# Patient Record
Sex: Female | Born: 1975 | Race: White | Hispanic: No | Marital: Married | Smoking: Never smoker
Health system: Southern US, Community
[De-identification: ages and names within clinical notes are randomized; demographics above are authoritative.]

## PROBLEM LIST (undated history)

## (undated) DIAGNOSIS — O1490 Unspecified pre-eclampsia, unspecified trimester: Secondary | ICD-10-CM

## (undated) HISTORY — PX: CHOLECYSTECTOMY: SHX55

## (undated) HISTORY — PX: ANKLE SURGERY: SHX546

---

## 2017-02-19 ENCOUNTER — Encounter (HOSPITAL_COMMUNITY): Payer: Self-pay

## 2017-02-19 ENCOUNTER — Emergency Department (HOSPITAL_COMMUNITY): Payer: Self-pay

## 2017-02-19 ENCOUNTER — Emergency Department (HOSPITAL_COMMUNITY)
Admission: EM | Admit: 2017-02-19 | Discharge: 2017-02-20 | Disposition: A | Discharge status: Home / Self Care | Payer: Self-pay | Attending: Emergency Medicine | Admitting: Emergency Medicine

## 2017-02-19 DIAGNOSIS — R059 Cough, unspecified: Secondary | ICD-10-CM

## 2017-02-19 DIAGNOSIS — K449 Diaphragmatic hernia without obstruction or gangrene: Secondary | ICD-10-CM | POA: Insufficient documentation

## 2017-02-19 DIAGNOSIS — K219 Gastro-esophageal reflux disease without esophagitis: Secondary | ICD-10-CM

## 2017-02-19 DIAGNOSIS — R05 Cough: Secondary | ICD-10-CM | POA: Insufficient documentation

## 2017-02-19 HISTORY — DX: Unspecified pre-eclampsia, unspecified trimester: O14.90

## 2017-02-19 MED ORDER — ALBUTEROL SULFATE (2.5 MG/3ML) 0.083% IN NEBU
5.0000 mg | INHALATION_SOLUTION | Freq: Once | RESPIRATORY_TRACT | Status: AC
  Administered 2017-02-19: 5 mg via RESPIRATORY_TRACT
  Filled 2017-02-19: qty 6

## 2017-02-19 MED ORDER — IBUPROFEN 800 MG PO TABS
800.0000 mg | ORAL_TABLET | Freq: Once | ORAL | Status: AC
  Administered 2017-02-19: 800 mg via ORAL
  Filled 2017-02-19: qty 1

## 2017-02-19 NOTE — ED Notes (Signed)
Pt complains of a headache similar to the preeclamptic headache she had after giving birth

## 2017-02-20 ENCOUNTER — Emergency Department (HOSPITAL_COMMUNITY): Payer: Self-pay

## 2017-02-20 ENCOUNTER — Encounter (HOSPITAL_COMMUNITY): Payer: Self-pay | Admitting: Emergency Medicine

## 2017-02-20 LAB — I-STAT CHEM 8, ED
BUN: 7 mg/dL (ref 6–20)
CHLORIDE: 107 mmol/L (ref 101–111)
CREATININE: 0.5 mg/dL (ref 0.44–1.00)
Calcium, Ion: 1.01 mmol/L — ABNORMAL LOW (ref 1.15–1.40)
GLUCOSE: 115 mg/dL — AB (ref 65–99)
HEMATOCRIT: 36 % (ref 36.0–46.0)
Hemoglobin: 12.2 g/dL (ref 12.0–15.0)
POTASSIUM: 3.6 mmol/L (ref 3.5–5.1)
Sodium: 139 mmol/L (ref 135–145)
TCO2: 21 mmol/L (ref 0–100)

## 2017-02-20 LAB — D-DIMER, QUANTITATIVE: D-Dimer, Quant: 1.78 ug/mL-FEU — ABNORMAL HIGH (ref 0.00–0.50)

## 2017-02-20 MED ORDER — IOPAMIDOL (ISOVUE-370) INJECTION 76%
100.0000 mL | Freq: Once | INTRAVENOUS | Status: AC | PRN
  Administered 2017-02-20: 100 mL via INTRAVENOUS

## 2017-02-20 MED ORDER — OMEPRAZOLE 20 MG PO CPDR: 20.0000 mg | DELAYED_RELEASE_CAPSULE | Freq: Every day | ORAL | 1 refills | Status: DC

## 2017-02-20 MED ORDER — GI COCKTAIL ~~LOC~~
30.0000 mL | Freq: Once | ORAL | Status: AC
  Administered 2017-02-20: 30 mL via ORAL
  Filled 2017-02-20: qty 30

## 2017-02-20 MED ORDER — ALUM & MAG HYDROXIDE-SIMETH 400-400-40 MG/5ML PO SUSP: 10.0000 mL | Freq: Four times a day (QID) | ORAL | 0 refills | Status: DC | PRN

## 2017-02-20 MED ORDER — PREDNISONE 20 MG PO TABS
60.0000 mg | ORAL_TABLET | Freq: Once | ORAL | Status: AC
  Administered 2017-02-20: 60 mg via ORAL
  Filled 2017-02-20: qty 3

## 2017-02-20 MED ORDER — LORATADINE 10 MG PO TABS: 10.0000 mg | ORAL_TABLET | Freq: Every day | ORAL | 0 refills | Status: DC

## 2017-02-20 MED ORDER — BENZONATATE 100 MG PO CAPS: 100.0000 mg | ORAL_CAPSULE | Freq: Three times a day (TID) | ORAL | 0 refills | Status: DC | PRN

## 2017-02-20 MED ORDER — LORATADINE 10 MG PO TABS
10.0000 mg | ORAL_TABLET | Freq: Once | ORAL | Status: AC
  Administered 2017-02-20: 10 mg via ORAL
  Filled 2017-02-20: qty 1

## 2017-02-20 MED ORDER — IOPAMIDOL (ISOVUE-370) INJECTION 76%
INTRAVENOUS | Status: AC
  Administered 2017-02-20: 100 mL via INTRAVENOUS
  Filled 2017-02-20: qty 100

## 2017-02-20 MED ORDER — BENZONATATE 100 MG PO CAPS
200.0000 mg | ORAL_CAPSULE | Freq: Once | ORAL | Status: AC
  Administered 2017-02-20: 200 mg via ORAL
  Filled 2017-02-20: qty 2

## 2017-02-20 NOTE — ED Notes (Signed)
Assisted patient with going to the restroom.

## 2017-02-20 NOTE — Discharge Instructions (Signed)
We believe that your symptoms may be due to reflux. You have a hiatal hernia seen on CT. This can predispose you to reflux symptoms. We advised the use of Prilosec daily as well as Maalox as needed. You may take Tessalon as needed for persisting cough. We also advise the use of a daily allergy medication. Follow-up with a primary care doctor for further evaluation of your symptoms. You may return to the emergency department if symptoms persist or worsen.

## 2017-02-20 NOTE — ED Notes (Signed)
Patient to CT.

## 2017-02-20 NOTE — ED Notes (Signed)
Patient return from CT

## 2017-02-20 NOTE — ED Provider Notes (Signed)
[facility] DEPT Provider Note   CSN: [id number] Arrival date & time: 02/19/17  1910   By signing my name below, I, Madison Murillo, attest that this documentation has been prepared under the direction and in the presence of [employer], [place]. Electronically Signed: Neta Murillo, ED Scribe. 02/20/2017. 12:59 AM.   History   Chief Complaint Chief Complaint  Patient presents with  . Shortness of Breath    The history is provided by the patient. No language interpreter was used.   HPI Comments:  Madison Murillo is a 41 y.o. female who presents to the Emergency Department complaining of an intermittent cough for several months. She states that 1 week ago her cough came back and has been persistent since, and is worse when laying down. Pt complains of associated chest pain with cough, SOB, dizziness, headache. She states, "when I cough, everything hurts". She reports hx of GERD. Denies hx of DVT/PE. Pt has taken Advil allergy & sinus and NyQuil with no relief for cough. She was given albuterol at the ED which caused her cough to worsen. She took Advil today with moderate relief for headache. Pt denies other associated symptoms.    Past Medical History:  Diagnosis Date  . Preeclampsia   . Vaginal delivery     There are no active problems to display for this patient.   Past Surgical History:  Procedure Laterality Date  . ANKLE SURGERY Left   . CHOLECYSTECTOMY      OB History    No data available       Home Medications    Prior to Admission medications   Medication Sig Start Date End Date Taking? Authorizing Provider  Phenylephrine-DM-GG-APAP (MUCINEX FAST-MAX CONGEST COLD) 5-10-200-325 MG TABS Take 1 tablet by mouth as needed (congestion).   Yes Historical Provider, MD  Pseudoephedrine-APAP-DM (DAYQUIL PO) Take 1 capsule by mouth every 6 (six) hours as needed (cold).   Yes Historical Provider, MD  Pseudoephedrine-Ibuprofen (ADVIL COLD/SINUS) 30-200 MG TABS Take 1  capsule by mouth every 6 (six) hours.   Yes Historical Provider, MD  alum & mag hydroxide-simeth (MAALOX ADVANCED MAX ST) 841-660-63 MG/5ML suspension Take 10 mLs by mouth every 6 (six) hours as needed for indigestion. 02/20/17   Antonietta Breach, PA-C  benzonatate (TESSALON) 100 MG capsule Take 1 capsule (100 mg total) by mouth 3 (three) times daily as needed for cough. 02/20/17   Antonietta Breach, PA-C  loratadine (CLARITIN) 10 MG tablet Take 1 tablet (10 mg total) by mouth daily. 02/20/17   Antonietta Breach, PA-C  omeprazole (PRILOSEC) 20 MG capsule Take 1 capsule (20 mg total) by mouth daily. 02/20/17   Antonietta Breach, PA-C    Family History History reviewed. No pertinent family history.  Social History Social History  Substance Use Topics  . Smoking status: Never Smoker  . Smokeless tobacco: Never Used  . Alcohol use No     Allergies   Codeine   Review of Systems Review of Systems All systems reviewed and are negative for acute change except as noted in the HPI.   Physical Exam Updated Vital Signs BP (!) 159/107 (BP Location: Right Arm)   Pulse 77   Temp 98.3 F (36.8 C) (Oral)   Resp 19   Ht 5\' 5"  (1.651 m)   Wt 127 kg   LMP 02/13/2017   SpO2 93%   BMI 46.59 kg/m   Physical Exam  Constitutional: She is oriented to person, place, and time. She appears well-developed  and well-nourished. No distress.  Super morbidly obese. Pleasant. In NAD.  HENT:  Head: Normocephalic and atraumatic.  Eyes: Conjunctivae and EOM are normal. No scleral icterus.  Neck: Normal range of motion.  Cardiovascular: Normal rate, regular rhythm and intact distal pulses.   Pulmonary/Chest: Effort normal. No respiratory distress. She has no wheezes. She has no rales.  Dry, harsh cough noted. Lungs grossly clear. Chest expansion symmetric.  Musculoskeletal: Normal range of motion.  Neurological: She is alert and oriented to person, place, and time. She exhibits normal muscle tone. Coordination normal.  GCS 15.  Patient moving all extremities.  Skin: Skin is warm and dry. No rash noted. She is not diaphoretic. No erythema. No pallor.  Psychiatric: She has a normal mood and affect. Her behavior is normal.  Nursing note and vitals reviewed.    ED Treatments / Results  DIAGNOSTIC STUDIES:  Oxygen Saturation is 97% on RA, normal by my interpretation.    COORDINATION OF CARE:  12:38 AM  Discussed treatment plan with pt at bedside and pt agreed to plan.   Labs (all labs ordered are listed, but only abnormal results are displayed) Labs Reviewed  D-DIMER, QUANTITATIVE (NOT AT [facility]) - Abnormal; Notable for the following:       Result Value   D-Dimer, Quant 1.78 (*)    All other components within normal limits  I-STAT CHEM 8, ED - Abnormal; Notable for the following:    Glucose, Bld 115 (*)    Calcium, Ion 1.01 (*)    All other components within normal limits    EKG  EKG Interpretation  Date/Time:  Tuesday Feb 19 2017 19:22:30 EDT Ventricular Rate:  116 PR Interval:    QRS Duration: 92 QT Interval:  329 QTC Calculation: 457 R Axis:   42 Text Interpretation:  Sinus tachycardia Borderline T abnormalities, inferior leads Baseline wander in lead(s) II III aVF V6 No previous ECGs available Confirmed by Florina Ou  MD, Jenny Reichmann ([id number]) on 02/19/2017 11:34:09 PM       Radiology Dg Chest 2 View  Result Date: 02/19/2017 CLINICAL DATA:  Shortness of breath and cough EXAM: CHEST  2 VIEW COMPARISON:  None. FINDINGS: The heart size and mediastinal contours are within normal limits. Both lungs are clear. The visualized skeletal structures are unremarkable. IMPRESSION: No active cardiopulmonary disease. Electronically Signed   By: Donavan Foil M.D.   On: 02/19/2017 20:10   Ct Angio Chest Pe W And/or Wo Contrast  Result Date: 02/20/2017 CLINICAL DATA:  Intermittent cough for several months. Chest pain tonight. EXAM: CT ANGIOGRAPHY CHEST WITH CONTRAST TECHNIQUE: Multidetector CT imaging of the chest was  performed using the standard protocol during bolus administration of intravenous contrast. Multiplanar CT image reconstructions and MIPs were obtained to evaluate the vascular anatomy. CONTRAST:  100 mL Isovue 370 intravenous COMPARISON:  Radiographs 02/19/2017 FINDINGS: Cardiovascular: Satisfactory opacification of the pulmonary arteries to the segmental level. No evidence of pulmonary embolism. Normal heart size. No pericardial effusion. Mediastinum/Nodes: No enlarged mediastinal, hilar, or axillary lymph nodes. Thyroid gland, trachea, and esophagus demonstrate no significant findings. Lungs/Pleura: Lungs are clear. No pleural effusion or pneumothorax. Upper Abdomen: Small hiatal hernia. Focal low-attenuation lesion in the right hepatic lobe measuring at least 2.8 cm, incompletely imaged and not characterized. Musculoskeletal: No significant skeletal lesion. Review of the MIP images confirms the above findings. IMPRESSION: 1. Negative for acute pulmonary embolism. 2. Small hiatal hernia. 3. Incompletely imaged low-attenuation lesion in the right hepatic lobe. CT or ultrasound may  be useful to characterize. Electronically Signed   By: Andreas Newport M.D.   On: 02/20/2017 04:00    Procedures Procedures (including critical care time)  Medications Ordered in ED Medications  albuterol (PROVENTIL) (2.5 MG/3ML) 0.083% nebulizer solution 5 mg (5 mg Nebulization Given 02/19/17 1931)  ibuprofen (ADVIL,MOTRIN) tablet 800 mg (800 mg Oral Given 02/19/17 2332)  gi cocktail (Maalox,Lidocaine,Donnatal) (30 mLs Oral Given 02/20/17 0137)  loratadine (CLARITIN) tablet 10 mg (10 mg Oral Given 02/20/17 0136)  predniSONE (DELTASONE) tablet 60 mg (60 mg Oral Given 02/20/17 0135)  benzonatate (TESSALON) capsule 200 mg (200 mg Oral Given 02/20/17 0136)  iopamidol (ISOVUE-370) 76 % injection 100 mL (100 mLs Intravenous Contrast Given 02/20/17 0338)     Initial Impression / Assessment and Plan / ED Course  I have reviewed the triage  vital signs and the nursing notes.  Pertinent labs & imaging results that were available during my care of the patient were reviewed by me and considered in my medical decision making (see chart for details).     41 year old female presents to the emergency department for evaluation of a cough. This has been present for several months. Cough is dry in nature. Patient is afebrile today. No significant hypoxia. Lung sounds clear on examination.  Unable to exclude possible pulmonary embolus given oxygen saturations documented below 95% and tachycardia on arrival. Patient is, overall, low risk for PE. She did have a positive d-dimer today. A CT angiogram was performed which was negative for PE.  CT did note a small hiatal hernia. Patient also endorses a history of reflux. I have a higher suspicion for reflux as the cause of her symptoms today. She has had improvement in her cough with supportive management in the emergency department. Plan to discharge with Prilosec and Maalox. Tessalon also provided for cough. I have recommended the use of a daily allergy medication. Patient to follow-up with a primary care doctor for recheck of symptoms. Return precautions discussed and provided. Patient discharged in stable condition with no unaddressed concerns.   Final Clinical Impressions(s) / ED Diagnoses   Final diagnoses:  Cough  Hiatal hernia with GERD    New Prescriptions Discharge Medication List as of 02/20/2017  4:40 AM    START taking these medications   Details  alum & mag hydroxide-simeth (MAALOX ADVANCED MAX ST) 357-017-79 MG/5ML suspension Take 10 mLs by mouth every 6 (six) hours as needed for indigestion., Starting Wed 02/20/2017, Print    benzonatate (TESSALON) 100 MG capsule Take 1 capsule (100 mg total) by mouth 3 (three) times daily as needed for cough., Starting Wed 02/20/2017, Print    loratadine (CLARITIN) 10 MG tablet Take 1 tablet (10 mg total) by mouth daily., Starting Wed 02/20/2017,  Print    omeprazole (PRILOSEC) 20 MG capsule Take 1 capsule (20 mg total) by mouth daily., Starting Wed 02/20/2017, Print       I personally performed the services described in this documentation, which was scribed in my presence. The recorded information has been reviewed and is accurate.       Antonietta Breach, PA-C 02/20/17 [place], MD 02/20/17 [phone]

## 2017-02-20 NOTE — ED Notes (Signed)
Attempted an IV x 2 with no success.  

## 2017-02-20 NOTE — ED Notes (Signed)
Patient is alert and oriented x3.  She was given DC instructions and follow up visit instructions.  Patient gave verbal understanding. She was DC ambulatory under her own power to home.  V/S stable.  He was not showing any signs of distress on DC 

## 2017-12-26 ENCOUNTER — Emergency Department (HOSPITAL_COMMUNITY): Payer: Self-pay

## 2017-12-26 ENCOUNTER — Encounter (HOSPITAL_COMMUNITY): Payer: Self-pay

## 2017-12-26 ENCOUNTER — Emergency Department (HOSPITAL_COMMUNITY)
Admission: EM | Admit: 2017-12-26 | Discharge: 2017-12-27 | Disposition: A | Discharge status: Home / Self Care | Payer: Self-pay | Attending: Emergency Medicine | Admitting: Emergency Medicine

## 2017-12-26 DIAGNOSIS — Z79899 Other long term (current) drug therapy: Secondary | ICD-10-CM | POA: Insufficient documentation

## 2017-12-26 DIAGNOSIS — H6092 Unspecified otitis externa, left ear: Secondary | ICD-10-CM | POA: Insufficient documentation

## 2017-12-26 DIAGNOSIS — R519 Headache, unspecified: Secondary | ICD-10-CM

## 2017-12-26 DIAGNOSIS — H60502 Unspecified acute noninfective otitis externa, left ear: Secondary | ICD-10-CM

## 2017-12-26 DIAGNOSIS — R51 Headache: Secondary | ICD-10-CM | POA: Insufficient documentation

## 2017-12-26 DIAGNOSIS — R079 Chest pain, unspecified: Secondary | ICD-10-CM | POA: Insufficient documentation

## 2017-12-26 LAB — BASIC METABOLIC PANEL
Anion gap: 14 (ref 5–15)
BUN: <5 mg/dL — ABNORMAL LOW (ref 6–20)
CHLORIDE: 101 mmol/L (ref 101–111)
CO2: 23 mmol/L (ref 22–32)
Calcium: 9.3 mg/dL (ref 8.9–10.3)
Creatinine, Ser: 0.56 mg/dL (ref 0.44–1.00)
GFR calc non Af Amer: >60 mL/min (ref >60)
Glucose, Bld: 117 mg/dL — ABNORMAL HIGH (ref 65–99)
POTASSIUM: 3.5 mmol/L (ref 3.5–5.1)
Sodium: 138 mmol/L (ref 135–145)

## 2017-12-26 LAB — CBC WITH DIFFERENTIAL/PLATELET
BASOS PCT: 0 %
Basophils Absolute: 0 10*3/uL (ref 0.0–0.1)
Eosinophils Absolute: 0.2 10*3/uL (ref 0.0–0.7)
Eosinophils Relative: 2 %
HEMATOCRIT: 36.3 % (ref 36.0–46.0)
HEMOGLOBIN: 11.5 g/dL — AB (ref 12.0–15.0)
LYMPHS ABS: 1.8 10*3/uL (ref 0.7–4.0)
Lymphocytes Relative: 19 %
MCH: 24.8 pg — AB (ref 26.0–34.0)
MCHC: 31.7 g/dL (ref 30.0–36.0)
MCV: 78.2 fL (ref 78.0–100.0)
MONOS PCT: 4 %
Monocytes Absolute: 0.4 10*3/uL (ref 0.1–1.0)
NEUTROS ABS: 7.1 10*3/uL (ref 1.7–7.7)
Neutrophils Relative %: 75 %
Platelets: 320 10*3/uL (ref 150–400)
RBC: 4.64 MIL/uL (ref 3.87–5.11)
RDW: 17.2 % — ABNORMAL HIGH (ref 11.5–15.5)
WBC: 9.5 10*3/uL (ref 4.0–10.5)

## 2017-12-26 LAB — I-STAT TROPONIN, ED: Troponin i, poc: 0 ng/mL (ref 0.00–0.08)

## 2017-12-26 LAB — D-DIMER, QUANTITATIVE: D-Dimer, Quant: 0.4 ug/mL-FEU (ref 0.00–0.50)

## 2017-12-26 MED ORDER — CIPROFLOXACIN-DEXAMETHASONE 0.3-0.1 % OT SUSP
4.0000 [drp] | Freq: Once | OTIC | Status: AC
Start: 2017-12-26 — End: 2017-12-27
  Administered 2017-12-27: 4 [drp] via OTIC
  Filled 2017-12-26: qty 7.5

## 2017-12-26 MED ORDER — OXYCODONE-ACETAMINOPHEN 5-325 MG PO TABS
1.0000 | ORAL_TABLET | Freq: Once | ORAL | Status: AC
  Administered 2017-12-27: 1 via ORAL
  Filled 2017-12-26: qty 1

## 2017-12-26 NOTE — ED Notes (Signed)
Pt refused vitals until she is place in a room to see doctor.

## 2017-12-26 NOTE — ED Triage Notes (Signed)
Pt presents with 2 day h/o L ear pain that is now to L side of face, denies any drainage to ear.  Today, pt was dealing with stressful phone call at work and began to have mid-sternal chest pain that radiates to mid-scapular area; +shortness of breath; chest pain worsens with deep inspiration.

## 2017-12-26 NOTE — ED Provider Notes (Signed)
Patient placed in Quick Look pathway, seen and evaluated   Chief Complaint: ear pain, chest pain, shortness of breath  HPI:   42 y.o. female here with left ear pain and left side facial pain that started 2 days ago and has gotten worse. Today patient reports she was at work and dealing with a stressful phone call and began having med-sternal chest pain and shortness of breath. She was not sure if it was just due to the stress or if something else was going on.   ROS: HENT: left ear pain  Cardiac: chest pain  Resp: shortness of breath  Physical Exam:  BP (!) 213/104 (BP Location: Right Arm)   Pulse 97   Temp 98.6 F (37 C) (Oral)   Resp 18   Ht 5\' 6"  (1.676 m)   Wt 108.9 kg (240 lb)   LMP 12/12/2017   SpO2 97%   BMI 38.74 kg/m    Gen: No distress  Neuro: Awake and Alert  Skin: Warm  Heart regular rate and rhythm  Lungs: without wheezing or rales. Chest  pain increases with deep breath.    Focused Exam:    Initiation of care has begun. The patient has been counseled on the process, plan, and necessity for staying for the completion/evaluation, and the remainder of the medical screening examination    Ashley Murrain, NP 12/26/17 Loretha Stapler    Duffy Bruce, MD 12/27/17 [phone]

## 2017-12-27 LAB — I-STAT TROPONIN, ED: Troponin i, poc: 0 ng/mL (ref 0.00–0.08)

## 2017-12-27 MED ORDER — OXYCODONE-ACETAMINOPHEN 5-325 MG PO TABS: 1.0000 | ORAL_TABLET | Freq: Four times a day (QID) | ORAL | 0 refills | Status: DC | PRN

## 2017-12-27 NOTE — ED Notes (Signed)
Attempted venipuncture for blood draw, x2, without success.

## 2017-12-27 NOTE — ED Provider Notes (Signed)
[facility] EMERGENCY DEPARTMENT Provider Note   CSN: [id number] Arrival date & time: 12/26/17  1828     History   Chief Complaint No chief complaint on file.   HPI Madison Murillo is a 42 y.o. female.  Patient with history of preeclampsia, no long-standing hypertension --presents with several days of left ear pain which started around the ear but has since radiated into the side of her face.  Pain has become severe tonight.  She has had associated blurry vision but no loss of vision.  Patient also reports having some mid chest pain started while at work today.  Seems to be related to when the pain in her left ear is more severe.  She states a feeling of fullness in the left ear.  She denies injuries.  No fevers, nausea, vomiting, or diarrhea.  She does not have a history of diabetes.  No history of high cholesterol, diabetes, smoking.  No known family history of coronary artery disease.       Past Medical History:  Diagnosis Date  . Preeclampsia   . Vaginal delivery     There are no active problems to display for this patient.   Past Surgical History:  Procedure Laterality Date  . ANKLE SURGERY Left   . CHOLECYSTECTOMY      OB History    No data available       Home Medications    Prior to Admission medications   Medication Sig Start Date End Date Taking? Authorizing Provider  alum & mag hydroxide-simeth (MAALOX ADVANCED MAX ST) 400-400-40 MG/5ML suspension Take 10 mLs by mouth every 6 (six) hours as needed for indigestion. 02/20/17   Antonietta Breach, PA-C  benzonatate (TESSALON) 100 MG capsule Take 1 capsule (100 mg total) by mouth 3 (three) times daily as needed for cough. 02/20/17   Antonietta Breach, PA-C  loratadine (CLARITIN) 10 MG tablet Take 1 tablet (10 mg total) by mouth daily. 02/20/17   Antonietta Breach, PA-C  omeprazole (PRILOSEC) 20 MG capsule Take 1 capsule (20 mg total) by mouth daily. 02/20/17   Antonietta Breach, PA-C  Phenylephrine-DM-GG-APAP (MUCINEX  FAST-MAX CONGEST COLD) 5-10-200-325 MG TABS Take 1 tablet by mouth as needed (congestion).    [provider]  Pseudoephedrine-APAP-DM (DAYQUIL PO) Take 1 capsule by mouth every 6 (six) hours as needed (cold).    [provider]  Pseudoephedrine-Ibuprofen (ADVIL COLD/SINUS) 30-200 MG TABS Take 1 capsule by mouth every 6 (six) hours.    [provider]    Family History History reviewed. No pertinent family history.  Social History Social History   Tobacco Use  . Smoking status: Never Smoker  . Smokeless tobacco: Never Used  Substance Use Topics  . Alcohol use: No  . Drug use: No     Allergies   Codeine   Review of Systems Review of Systems  Constitutional: Negative for diaphoresis and fever.  HENT: Positive for ear pain and hearing loss. Negative for ear discharge and facial swelling.   Eyes: Positive for visual disturbance (Blurry vision). Negative for redness.  Respiratory: Positive for shortness of breath. Negative for cough.   Cardiovascular: Positive for chest pain. Negative for palpitations and leg swelling.  Gastrointestinal: Negative for abdominal pain, nausea and vomiting.  Genitourinary: Negative for dysuria.  Musculoskeletal: Negative for back pain and neck pain.  Skin: Negative for rash.  Neurological: Negative for syncope and light-headedness.  Psychiatric/Behavioral: The patient is not nervous/anxious.      Physical  Exam Updated Vital Signs BP (!) 178/106 (BP Location: Right Arm)   Pulse 100   Temp 98.6 F (37 C) (Oral)   Resp 19   Ht 5\' 6"  (1.676 m)   Wt 108.9 kg (240 lb)   LMP 12/12/2017   SpO2 100%   BMI 38.74 kg/m   Physical Exam  Constitutional: She appears well-developed and well-nourished.  HENT:  Head: Normocephalic and atraumatic.  Right Ear: Tympanic membrane, external ear and ear canal normal. No drainage or swelling.  Left Ear: Tympanic membrane normal. There is drainage and swelling.  Patient with  swelling of the left TM with associated debris and tenderness with insertion of the speculum, all consistent with otitis externa.  Patient has generalized tenderness without swelling around the ear itself.  There is no erythema.  No focal mastoid tenderness.  Eyes: Conjunctivae are normal. Right eye exhibits no discharge. Left eye exhibits no discharge.  Neck: Normal range of motion. Neck supple.  Cardiovascular: Normal rate, regular rhythm and normal heart sounds.  Pulmonary/Chest: Effort normal and breath sounds normal. No stridor. No respiratory distress. She has no wheezes. She exhibits no tenderness.  Abdominal: Soft. There is no tenderness. There is no rebound and no guarding.  Musculoskeletal: She exhibits no edema or tenderness.  No clinical signs and symptoms of DVT.  Neurological: She is alert.  Skin: Skin is warm and dry.  Psychiatric: She has a normal mood and affect.  Nursing note and vitals reviewed.    ED Treatments / Results  Labs (all labs ordered are listed, but only abnormal results are displayed) Labs Reviewed  CBC WITH DIFFERENTIAL/PLATELET - Abnormal; Notable for the following components:      Result Value   Hemoglobin 11.5 (*)    MCH 24.8 (*)    RDW 17.2 (*)    All other components within normal limits  BASIC METABOLIC PANEL - Abnormal; Notable for the following components:   Glucose, Bld 117 (*)    BUN <5 (*)    All other components within normal limits  D-DIMER, QUANTITATIVE (NOT AT [facility])  I-STAT TROPONIN, ED  I-STAT TROPONIN, ED    ED ECG REPORT   Date: 12/27/2017  Rate: 105  Rhythm: sinus tachycardia  QRS Axis: normal  Intervals: normal  ST/T Wave abnormalities: nonspecific T wave changes  Conduction Disutrbances:none  Narrative Interpretation:   Old EKG Reviewed: unchanged  I have personally reviewed the EKG tracing and agree with the computerized printout as noted.   ED ECG REPORT (repeat)   Date: 12/27/2017  Rate: 96  Rhythm: normal  sinus rhythm  QRS Axis: normal  Intervals: normal  ST/T Wave abnormalities: nonspecific T wave changes  Conduction Disutrbances:none  Narrative Interpretation:   Old EKG Reviewed: unchanged except slower  I have personally reviewed the EKG tracing and agree with the computerized printout as noted.   Radiology Dg Chest 2 View  Result Date: 12/26/2017 CLINICAL DATA:  Patient with left ear pain.  Chest pain. EXAM: CHEST - 2 VIEW COMPARISON:  CT chest 02/20/2017. FINDINGS: Normal cardiac and mediastinal contours. No consolidative pulmonary opacities. No pleural effusion or pneumothorax. Regional skeleton is unremarkable. IMPRESSION: No acute cardiopulmonary process. Electronically Signed   By: Lovey Newcomer M.D.   On: 12/26/2017 19:34    Procedures Procedures (including critical care time)  Medications Ordered in ED Medications  oxyCODONE-acetaminophen (PERCOCET/ROXICET) 5-325 MG per tablet 1 tablet (1 tablet Oral Given 12/27/17 0018)  ciprofloxacin-dexamethasone (CIPRODEX) 0.3-0.1 % OTIC (EAR) suspension 4 drop (  4 drops Left EAR Given 12/27/17 0018)     Initial Impression / Assessment and Plan / ED Course  I have reviewed the triage vital signs and the nursing notes.  Pertinent labs & imaging results that were available during my care of the patient were reviewed by me and considered in my medical decision making (see chart for details).     Patient seen and examined.  EKG reviewed.  Workup ordered previously reviewed, added repeat troponin and EKG.  Vital signs reviewed and are as follows: BP (!) 178/106 (BP Location: Right Arm)   Pulse 100   Temp 98.6 F (37 C) (Oral)   Resp 19   Ht 5\' 6"  (1.676 m)   Wt 108.9 kg (240 lb)   LMP 12/12/2017   SpO2 100%   BMI 38.74 kg/m   Patient with clinical signs and symptoms of left-sided otitis externa.  Patient discussed with Dr. Betsey Holiday. Patient with market hypertension, likely related in part due to pain.  Medication ordered.  D-dimer  negative.  Discussed potential etiology of vascular dissection given ear/facial pain in setting of elevated BP. But patient has no vision loss and has other obviously apparent etiology of pain -- will not perform imaging now. Pt will need ENT f/u. Will start on abx ear drops and treat pain.   Patient counseled on use of narcotic pain medications. Counseled not to combine these medications with others containing tylenol. Urged not to drink alcohol, drive, or perform any other activities that requires focus while taking these medications. The patient verbalizes understanding and agrees with the plan.     Visual Acuity  Right Eye Distance: 10/40 Left Eye Distance: 10/50 Bilateral Distance: 10/40  Right Eye Near:   Left Eye Near:    Bilateral Near:      Final Clinical Impressions(s) / ED Diagnoses   Final diagnoses:  Acute otitis externa of left ear, unspecified type  Chest pain, unspecified type  Facial pain   Otitis externa: Clinical diagnosis.  Patient does have significant pain around the ear but no redness or warmth.  No focal mastoid tenderness.  No risk factors for malignant otitis externa.  She is not febrile and has normal white count.  Do not feel that she requires advanced imaging at this point.  I do not suspect a dissection.  Encourage close PCP/ENT follow-up.  Chest pain: Seems to be related to more severe ear pain.  She has unchanged EKG x2, troponin negative x2.  Low concern for ACS at this point given her other symptoms.  D-dimer was negative.  Hypertension: Likely elevated in part due to pain.  She will need to have this rechecked.  ED Discharge Orders        Ordered    oxyCODONE-acetaminophen (PERCOCET/ROXICET) 5-325 MG tablet  Every 6 hours PRN     12/27/17 0133       Carlisle Cater, PA-C 12/27/17 0142    Orpah Greek, MD 12/27/17 [phone]

## 2017-12-27 NOTE — ED Notes (Signed)
Pt commenting that she "can now feel a lump behind her L ear." Requesting to see the PA. PA notified and at bedside.

## 2017-12-27 NOTE — Discharge Instructions (Signed)
Please read and follow all provided instructions.  Your diagnoses today include:  1. Acute otitis externa of left ear, unspecified type   2. Chest pain, unspecified type   3. Facial pain     Tests performed today include:  An EKG of your heart - no signs of heart attack  A chest x-ray  Cardiac enzymes - a blood test for heart muscle damage, no sign of heart attack  Blood counts and electrolytes - normal infection fighting cells  Blood test for blood clot - negative  Vital signs. See below for your results today.   Medications prescribed:   Ciprodex ear drops - instill 4 drops into affected ear twice daily for 7 days   Percocet (oxycodone/acetaminophen) - narcotic pain medication  DO NOT drive or perform any activities that require you to be awake and alert because this medicine can make you drowsy. BE VERY CAREFUL not to take multiple medicines containing Tylenol (also called acetaminophen). Doing so can lead to an overdose which can damage your liver and cause liver failure and possibly death.  Take any prescribed medications only as directed.  Follow-up instructions: Please follow-up with your primary care provider as soon as you can for further evaluation of your symptoms and also to have your blood pressure rechecked. \  Call the ENT office tomorrow for follow-up.   Return instructions:  Return if you have worsening redness, swelling of your face, fever.  Additional Information: Chest pain comes from many different causes. Your caregiver has diagnosed you as having chest pain that is not specific for one problem, but does not require admission.  You are at low risk for an acute heart condition or other serious illness.   Your vital signs today were: BP (!) 175/101    Pulse (!) 107    Temp 98.6 F (37 C) (Oral)    Resp 20    Ht 5\' 6"  (1.676 m)    Wt 108.9 kg (240 lb)    LMP 12/12/2017    SpO2 96%    BMI 38.74 kg/m  If your blood pressure (BP) was elevated above  135/85 this visit, please have this repeated by your doctor within one month. --------------

## 2018-03-26 ENCOUNTER — Emergency Department (HOSPITAL_COMMUNITY)
Admission: EM | Admit: 2018-03-26 | Discharge: 2018-03-26 | Disposition: A | Discharge status: Home / Self Care | Payer: Self-pay | Attending: Emergency Medicine | Admitting: Emergency Medicine

## 2018-03-26 ENCOUNTER — Emergency Department (HOSPITAL_COMMUNITY): Payer: Self-pay

## 2018-03-26 ENCOUNTER — Other Ambulatory Visit: Payer: Self-pay

## 2018-03-26 DIAGNOSIS — R200 Anesthesia of skin: Secondary | ICD-10-CM | POA: Insufficient documentation

## 2018-03-26 DIAGNOSIS — R51 Headache: Secondary | ICD-10-CM | POA: Insufficient documentation

## 2018-03-26 DIAGNOSIS — I10 Essential (primary) hypertension: Secondary | ICD-10-CM | POA: Insufficient documentation

## 2018-03-26 DIAGNOSIS — R519 Headache, unspecified: Secondary | ICD-10-CM

## 2018-03-26 LAB — COMPREHENSIVE METABOLIC PANEL
ALT: 64 U/L — AB (ref 14–54)
AST: 20 U/L (ref 15–41)
Albumin: 3.1 g/dL — ABNORMAL LOW (ref 3.5–5.0)
Alkaline Phosphatase: 89 U/L (ref 38–126)
Anion gap: 5 (ref 5–15)
BILIRUBIN TOTAL: 0.4 mg/dL (ref 0.3–1.2)
BUN: 6 mg/dL (ref 6–20)
CALCIUM: 8.9 mg/dL (ref 8.9–10.3)
CO2: 28 mmol/L (ref 22–32)
CREATININE: 0.63 mg/dL (ref 0.44–1.00)
Chloride: 107 mmol/L (ref 101–111)
GFR calc non Af Amer: >60 mL/min (ref >60)
Glucose, Bld: 114 mg/dL — ABNORMAL HIGH (ref 65–99)
Potassium: 4.1 mmol/L (ref 3.5–5.1)
Sodium: 140 mmol/L (ref 135–145)
TOTAL PROTEIN: 6.6 g/dL (ref 6.5–8.1)

## 2018-03-26 LAB — CBC WITH DIFFERENTIAL/PLATELET
Abs Immature Granulocytes: 0.1 10*3/uL (ref 0.0–0.1)
BASOS ABS: 0 10*3/uL (ref 0.0–0.1)
BASOS PCT: 0 %
EOS ABS: 0.4 10*3/uL (ref 0.0–0.7)
EOS PCT: 5 %
HCT: 36 % (ref 36.0–46.0)
Hemoglobin: 10.9 g/dL — ABNORMAL LOW (ref 12.0–15.0)
Immature Granulocytes: 1 %
Lymphocytes Relative: 22 %
Lymphs Abs: 2.1 10*3/uL (ref 0.7–4.0)
MCH: 23.7 pg — ABNORMAL LOW (ref 26.0–34.0)
MCHC: 30.3 g/dL (ref 30.0–36.0)
MCV: 78.3 fL (ref 78.0–100.0)
Monocytes Absolute: 0.5 10*3/uL (ref 0.1–1.0)
Monocytes Relative: 5 %
Neutro Abs: 6.5 10*3/uL (ref 1.7–7.7)
Neutrophils Relative %: 67 %
PLATELETS: 323 10*3/uL (ref 150–400)
RBC: 4.6 MIL/uL (ref 3.87–5.11)
RDW: 17.1 % — AB (ref 11.5–15.5)
WBC: 9.6 10*3/uL (ref 4.0–10.5)

## 2018-03-26 LAB — I-STAT TROPONIN, ED: TROPONIN I, POC: 0 ng/mL (ref 0.00–0.08)

## 2018-03-26 MED ORDER — PROCHLORPERAZINE EDISYLATE 10 MG/2ML IJ SOLN
10.0000 mg | Freq: Once | INTRAMUSCULAR | Status: AC
  Administered 2018-03-26: 10 mg via INTRAVENOUS
  Filled 2018-03-26: qty 2

## 2018-03-26 MED ORDER — LISINOPRIL 10 MG PO TABS: 10.0000 mg | ORAL_TABLET | Freq: Every day | ORAL | 1 refills | Status: DC

## 2018-03-26 MED ORDER — HYDRALAZINE HCL 20 MG/ML IJ SOLN
10.0000 mg | INTRAMUSCULAR | Status: DC
  Filled 2018-03-26: qty 1

## 2018-03-26 MED ORDER — KETOROLAC TROMETHAMINE 30 MG/ML IJ SOLN
30.0000 mg | Freq: Once | INTRAMUSCULAR | Status: AC
  Administered 2018-03-26: 30 mg via INTRAVENOUS
  Filled 2018-03-26: qty 1

## 2018-03-26 MED ORDER — BUTALBITAL-APAP-CAFFEINE 50-325-40 MG PO TABS: 1.0000 | ORAL_TABLET | Freq: Four times a day (QID) | ORAL | 0 refills | Status: AC | PRN

## 2018-03-26 MED ORDER — SODIUM CHLORIDE 0.9 % IV SOLN
INTRAVENOUS | Status: DC
  Administered 2018-03-26: 1000 mL via INTRAVENOUS

## 2018-03-26 NOTE — Discharge Instructions (Signed)
Your symptoms today have resolved with the medications we have given.  Your symptoms were likely related to a complicated migraine which gave you the neurologic symptoms and have resolved with treatment of your headache.  Please take Tylenol or Fioricet for your headache.  Please avoid anti-inflammatory medication such as Advil or Naprosyn as they may cause her blood pressure to go up.  You should see your family doctor within 2 weeks for recheck of your blood pressure    Please take lisinopril 10 mg a day for the next 30 days, this may cause a cough or swelling of the mouth or tongue, if that occurs please stop taking it immediately and call your doctor or return to the emergency department if you cannot be seen immediately.  Emergency department for severe or worsening chest pain, headache, numbness or weakness difficulty speaking, difficulty with vision or balance.  Please obtain all of your results from medical records or have your doctors office obtain the results - share them with your doctor - you should be seen at your doctors office in the next 2 days. Call today to arrange your follow up. Take the medications as prescribed. Please review all of the medicines and only take them if you do not have an allergy to them. Please be aware that if you are taking birth control pills, taking other prescriptions, ESPECIALLY ANTIBIOTICS may make the birth control ineffective - if this is the case, either do not engage in sexual activity or use alternative methods of birth control such as condoms until you have finished the medicine and your family doctor says it is OK to restart them. If you are on a blood thinner such as COUMADIN, be aware that any other medicine that you take may cause the coumadin to either work too much, or not enough - you should have your coumadin level rechecked in next 7 days if this is the case.  ?  It is also a possibility that you have an allergic reaction to any of the medicines  that you have been prescribed - Everybody reacts differently to medications and while MOST people have no trouble with most medicines, you may have a reaction such as nausea, vomiting, rash, swelling, shortness of breath. If this is the case, please stop taking the medicine immediately and contact your physician.  ?  You should return to the ER if you develop severe or worsening symptoms.

## 2018-03-26 NOTE — ED Triage Notes (Addendum)
Pt from parking lot- was driving and pulled over due to headache, chest pain radiating down arm. Hypertensive 240/130. EKG NSR. Neuro intact. Does not take BP meds. Obese. EMS gave 1 nitro and 4 aspirin.

## 2018-03-26 NOTE — ED Provider Notes (Signed)
[facility] EMERGENCY DEPARTMENT Provider Note   CSN: [id number] Arrival date & time: 03/26/18  1052     History   Chief Complaint Chief Complaint  Patient presents with  . Chest Pain    HPI Madison Murillo is a 42 y.o. female.  HPI  42 y/o female - started with HA 3 days ago - BP was 218/120 - she was told by pharmacist to get checked out but she didn't - the HA has persisted and today on the way to work she has tightness in the chest and radiation in the L arm -she had difficulty driving her vehicle for some reason, she ultimately had to pull over and call 911 for transport to the hospital.  She states her hands are going numb.  She has had some nausea and diarrhea for a couple of days - she has had some nitro and asa en route - no longer having chest tinghtess - no numbness left.  She has no tobacco use - states she had post partum preeclampsia - but no longer takes medicines (had 2 preg with same).  Takes ibuprofn bid short term for recent headache.  Past Medical History:  Diagnosis Date  . Preeclampsia   . Vaginal delivery     There are no active problems to display for this patient.   Past Surgical History:  Procedure Laterality Date  . ANKLE SURGERY Left   . CHOLECYSTECTOMY       OB History   None      Home Medications    Prior to Admission medications   Medication Sig Start Date End Date Taking? Authorizing Provider  butalbital-acetaminophen-caffeine (FIORICET, ESGIC) [phone] MG tablet Take 1-2 tablets by mouth every 6 (six) hours as needed for headache. 03/26/18 03/26/19  Noemi Chapel, MD  lisinopril (PRINIVIL,ZESTRIL) 10 MG tablet Take 1 tablet (10 mg total) by mouth daily. 03/26/18   Noemi Chapel, MD  oxyCODONE-acetaminophen (PERCOCET/ROXICET) 5-325 MG tablet Take 1-2 tablets by mouth every 6 (six) hours as needed for severe pain. 12/27/17   Carlisle Cater, PA-C    Family History No family history on file.  Social History Social  History   Tobacco Use  . Smoking status: Never Smoker  . Smokeless tobacco: Never Used  Substance Use Topics  . Alcohol use: No  . Drug use: No     Allergies   Codeine   Review of Systems Review of Systems  All other systems reviewed and are negative.    Physical Exam Updated Vital Signs BP (!) 163/94 (BP Location: Right Arm)   Pulse 94   Temp 98.1 F (36.7 C) (Oral)   Resp 20   LMP 02/26/2018   SpO2 98%   Physical Exam  Constitutional: She appears well-developed and well-nourished. No distress.  HENT:  Head: Normocephalic and atraumatic.  Mouth/Throat: Oropharynx is clear and moist. No oropharyngeal exudate.  Eyes: Pupils are equal, round, and reactive to light. Conjunctivae and EOM are normal. Right eye exhibits no discharge. Left eye exhibits no discharge. No scleral icterus.  Neck: Normal range of motion. Neck supple. No JVD present. No thyromegaly present.  Cardiovascular: Normal rate, regular rhythm, normal heart sounds and intact distal pulses. Exam reveals no gallop and no friction rub.  No murmur heard. Pulmonary/Chest: Effort normal and breath sounds normal. No respiratory distress. She has no wheezes. She has no rales.  Abdominal: Soft. Bowel sounds are normal. She exhibits no distension and no mass. There is no tenderness.  Musculoskeletal: Normal range of motion. She exhibits no edema or tenderness.  Lymphadenopathy:    She has no cervical adenopathy.  Neurological: She is alert. Coordination normal.  Skin: Skin is warm and dry. No rash noted. No erythema.  Psychiatric: She has a normal mood and affect. Her behavior is normal.  Nursing note and vitals reviewed.   ED Treatments / Results  Labs (all labs ordered are listed, but only abnormal results are displayed) Labs Reviewed  COMPREHENSIVE METABOLIC PANEL - Abnormal; Notable for the following components:      Result Value   Glucose, Bld 114 (*)    Albumin 3.1 (*)    ALT 64 (*)    All other  components within normal limits  CBC WITH DIFFERENTIAL/PLATELET - Abnormal; Notable for the following components:   Hemoglobin 10.9 (*)    MCH 23.7 (*)    RDW 17.1 (*)    All other components within normal limits  I-STAT TROPONIN, ED    EKG EKG Interpretation  Date/Time:  Wednesday March 26 2018 10:59:14 EDT Ventricular Rate:  93 PR Interval:  128 QRS Duration: 80 QT Interval:  368 QTC Calculation: 457 R Axis:   51 Text Interpretation:  Normal sinus rhythm T wave abnormality, consider inferior ischemia Abnormal ECG c/w 3/19, no significant changes Confirmed by Noemi Chapel [phone]) on 03/26/2018 11:33:07 AM   Radiology Dg Chest 2 View  Result Date: 03/26/2018 CLINICAL DATA:  Chest pain. EXAM: CHEST - 2 VIEW COMPARISON:  Radiographs of December 26, 2017. FINDINGS: The heart size and mediastinal contours are within normal limits. Both lungs are clear. No pneumothorax or pleural effusion is noted. The visualized skeletal structures are unremarkable. IMPRESSION: No active cardiopulmonary disease. Electronically Signed   By: Marijo Conception, M.D.   On: 03/26/2018 11:34   Ct Head Wo Contrast  Result Date: 03/26/2018 CLINICAL DATA:  42 year old female with acute headache and hypertension. EXAM: CT HEAD WITHOUT CONTRAST TECHNIQUE: Contiguous axial images were obtained from the base of the skull through the vertex without intravenous contrast. COMPARISON:  None. FINDINGS: Brain: No evidence of acute infarction, hemorrhage, hydrocephalus, extra-axial collection or mass lesion/mass effect. Vascular: No hyperdense vessel or unexpected calcification. Skull: Normal. Negative for fracture or focal lesion. Sinuses/Orbits: No acute finding. Other: None. IMPRESSION: Unremarkable noncontrast head CT. Electronically Signed   By: Margarette Canada M.D.   On: 03/26/2018 13:55    Procedures Procedures (including critical care time)  Medications Ordered in ED Medications  0.9 %  sodium chloride infusion (1,000 mLs  Intravenous New Bag/Given 03/26/18 1430)  hydrALAZINE (APRESOLINE) injection 10 mg (10 mg Intravenous Not Given 03/26/18 1434)  prochlorperazine (COMPAZINE) injection 10 mg (10 mg Intravenous Given 03/26/18 1431)  ketorolac (TORADOL) 30 MG/ML injection 30 mg (30 mg Intravenous Given 03/26/18 1435)     Initial Impression / Assessment and Plan / ED Course  I have reviewed the triage vital signs and the nursing notes.  Pertinent labs & imaging results that were available during my care of the patient were reviewed by me and considered in my medical decision making (see chart for details).     The patient's symptoms are consistent with some type of neuro etiology, would consider complicate a migraine, stroke though this would be less likely given bilateral hand symptoms, she definitely has some give out weakness of the left hand and left leg but overall strength is normal.  She has a slight sensory deficit to the left upper extremity but her face and  cranial nerves are normal.  She has been having some floaters in her vision, bright lights flashing, she does report having prior migraines in the past where she had some type of visual aura though it was different than this.  We will treat blood pressure as needed, CT scan to rule out stroke, anticipate discharge if patient better.  CT negative, labs unremarkable, the patient has been treated for her blood pressure and states that she now feels completely better after the medications including the headache cocktail in the antihypertensive.  I had a long discussion with the patient regarding the need for chronic therapy for her essential hypertension as well as the potential risks of using lisinopril, she has agreed to these risks.  Final Clinical Impressions(s) / ED Diagnoses   Final diagnoses:  Acute nonintractable headache, unspecified headache type  Numbness  Essential hypertension    ED Discharge Orders        Ordered     butalbital-acetaminophen-caffeine (FIORICET, ESGIC) 50-325-40 MG tablet  Every 6 hours PRN     03/26/18 1559    lisinopril (PRINIVIL,ZESTRIL) 10 MG tablet  Daily     03/26/18 1559       Noemi Chapel, MD 03/26/18 [phone]

## 2018-07-29 ENCOUNTER — Other Ambulatory Visit: Payer: Self-pay

## 2018-07-29 ENCOUNTER — Emergency Department (HOSPITAL_COMMUNITY): Payer: Self-pay

## 2018-07-29 ENCOUNTER — Encounter (HOSPITAL_COMMUNITY): Payer: Self-pay | Admitting: Emergency Medicine

## 2018-07-29 ENCOUNTER — Emergency Department (HOSPITAL_COMMUNITY)
Admission: EM | Admit: 2018-07-29 | Discharge: 2018-07-29 | Disposition: A | Discharge status: Home / Self Care | Payer: Self-pay | Attending: Emergency Medicine | Admitting: Emergency Medicine

## 2018-07-29 DIAGNOSIS — S8011XA Contusion of right lower leg, initial encounter: Secondary | ICD-10-CM | POA: Insufficient documentation

## 2018-07-29 DIAGNOSIS — Z79899 Other long term (current) drug therapy: Secondary | ICD-10-CM | POA: Insufficient documentation

## 2018-07-29 DIAGNOSIS — Y999 Unspecified external cause status: Secondary | ICD-10-CM | POA: Insufficient documentation

## 2018-07-29 DIAGNOSIS — Y939 Activity, unspecified: Secondary | ICD-10-CM | POA: Insufficient documentation

## 2018-07-29 DIAGNOSIS — W01198A Fall on same level from slipping, tripping and stumbling with subsequent striking against other object, initial encounter: Secondary | ICD-10-CM | POA: Insufficient documentation

## 2018-07-29 DIAGNOSIS — Y929 Unspecified place or not applicable: Secondary | ICD-10-CM | POA: Insufficient documentation

## 2018-07-29 MED ORDER — HYDROCODONE-ACETAMINOPHEN 5-325 MG PO TABS: 1.0000 | ORAL_TABLET | Freq: Four times a day (QID) | ORAL | 0 refills | Status: DC | PRN

## 2018-07-29 NOTE — ED Notes (Signed)
ED Provider at bedside. 

## 2018-07-29 NOTE — ED Notes (Signed)
Pt verbalizes understanding of d/c instructions. Prescriptions reviewed with patient. Pt taken to lobby in wheelchair at d/c with all belongings and with family.   

## 2018-07-29 NOTE — ED Triage Notes (Addendum)
Pt reports mechanical fall, tripping over a mat and falling back, landing on her calf with her body, two days ago with pain increasing. Right leg injury with swelling and cool to touch. Bruising noted. Denies blood thinners. PMS intact. Pt reports pins and needles/numb feeling to her right foot - like her foot is asleep. Pain with movement and difficulty ambulating.

## 2018-07-29 NOTE — ED Notes (Signed)
Patient transported to X-ray 

## 2018-07-29 NOTE — ED Provider Notes (Signed)
Patient placed in Quick Look pathway, seen and evaluated   Chief Complaint: leg pain   HPI:   Mechanical fall from standing 2 days ago, tripped and fell on the outside of her right leg mostly on her lateral tib/fib. She was able to stand up on her own and ambulate the rest of the day.  Swelling, bruising, pain worsening. Associated with paresthesias.   ROS: negative: anticoagulants.   Physical Exam:   Gen: No distress  Neuro: Awake and Alert  Skin: Warm    Focused Exam: moderate ecchymosis to anterior/lateral tib/fib with exquisite tenderness locally to ecchymosis.  Moderate pain reported with passive flexion and extension of right ankle/knee.  1+ DP pulses bilaterally. Toes warm. 5/5 strength with ankle flexion/extension against resistance.    Initiation of care has begun. The patient has been counseled on the process, plan, and necessity for staying for the completion/evaluation, and the remainder of the medical screening examination    Arlean Hopping 07/29/18 1900    Quintella Reichert, MD 07/30/18 [phone]

## 2018-07-29 NOTE — ED Provider Notes (Signed)
[facility] EMERGENC[facility] DEPARTMENT Provider Note   CSN: [id number] Arrival date & time: 07/29/18  1836     History   Chief Complaint Chief Complaint  Patient presents with  . Leg Pain  . Fall    HPI Madison Murillo is a 42 y.o. female.  Patient stumbled and fell onto her right lower leg on Saturday, 07/26/18. She landed on her shin and the lateral aspect of the leg. She has had increasing pain, bruising, and paresthesias of the anterior leg and lateral calf. Pain increased with flexion and extension of the foot. No posterior calf pain. She is not on blood thinners.  The history is provided by the patient. No language interpreter was used.  Leg Pain   This is a new problem. The current episode started more than 2 days ago. The problem occurs constantly. The problem has been gradually worsening. The pain is present in the right lower leg. The quality of the pain is described as aching. The pain is moderate. Associated symptoms include tingling. Pertinent negatives include no numbness.  Fall     Past Medical History:  Diagnosis Date  . Preeclampsia   . Vaginal delivery     There are no active problems to display for this patient.   Past Surgical History:  Procedure Laterality Date  . ANKLE SURGERY Left   . CHOLECYSTECTOMY       OB History   None      Home Medications    Prior to Admission medications   Medication Sig Start Date End Date Taking? Authorizing Provider  butalbital-acetaminophen-caffeine (FIORICET, ESGIC) [phone] MG tablet Take 1-2 tablets by mouth every 6 (six) hours as needed for headache. 03/26/18 03/26/19  Noemi Chapel, MD  lisinopril (PRINIVIL,ZESTRIL) 10 MG tablet Take 1 tablet (10 mg total) by mouth daily. 03/26/18   Noemi Chapel, MD  oxyCODONE-acetaminophen (PERCOCET/ROXICET) 5-325 MG tablet Take 1-2 tablets by mouth every 6 (six) hours as needed for severe pain. 12/27/17   Carlisle Cater, PA-C    Family History No family  history on file.  Social History Social History   Tobacco Use  . Smoking status: Never Smoker  . Smokeless tobacco: Never Used  Substance Use Topics  . Alcohol use: No  . Drug use: No     Allergies   Codeine   Review of Systems Review of Systems  Neurological: Positive for tingling. Negative for numbness.  All other systems reviewed and are negative.    Physical Exam Updated Vital Signs BP (!) 188/86 (BP Location: Left Wrist)   Pulse (!) 105   Temp 98.5 F (36.9 C) (Oral)   Resp 18   Ht 5\' 5"  (1.651 m)   Wt 127 kg   LMP 07/18/2018   SpO2 99%   BMI 46.59 kg/m   Physical Exam  Constitutional: She is oriented to person, place, and time. She appears well-developed and well-nourished.  HENT:  Head: Atraumatic.  Eyes: Conjunctivae are normal.  Neck: Neck supple.  Cardiovascular: Normal rate and regular rhythm.  Pulmonary/Chest: Effort normal and breath sounds normal.  Abdominal: Soft.  Musculoskeletal: She exhibits edema and tenderness. She exhibits no deformity.  Bruising noted--see attached photo. Distal pulses and sensation present.   Neurological: She is alert and oriented to person, place, and time.  Skin: Skin is warm and dry.  Psychiatric: She has a normal mood and affect.  Nursing note and vitals reviewed.      ED Treatments / Results  Labs (all  labs ordered are listed, but only abnormal results are displayed) Labs Reviewed - No data to display  EKG None  Radiology Dg Tibia/fibula Right  Result Date: 07/29/2018 CLINICAL DATA:  Fall 2 days ago, lower leg pain. EXAM: RIGHT TIBIA AND FIBULA - 2 VIEW COMPARISON:  None. FINDINGS: Subcutaneous edema in the calf.  Anterior vascular phleboliths. Medial and lateral compartmental marginal spurring in the knee. No fracture is identified. Indistinct sclerosis in the distal fibular metaphysis, probably from a chronic ossified fibrous cortical defect or similar benign lesion. There is no associated periosteal  reaction to suggest an aggressive process. IMPRESSION: 1. No acute bony findings. 2. Mild subcutaneous edema in the calf. 3. Sclerosis in the distal tibial metaphysis, probably benign such as from a chronic ossified fibrous cortical defect or bone island. No appreciable periosteal reaction or other aggressive findings. 4. Vascular phleboliths. Electronically Signed   By: Van Clines M.D.   On: 07/29/2018 20:21   Dg Knee Complete 4 Views Right  Result Date: 07/29/2018 CLINICAL DATA:  Fall 2 days ago, knee pain. EXAM: RIGHT KNEE - COMPLETE 4+ VIEW COMPARISON:  None. FINDINGS: Tricompartmental marginal spurring. No definite effusion in the suprapatellar bursa. Soft tissue swelling anterior to the knee and proximal tibia but without high density to suggest prepatellar bursitis. There is spurring of the tibial spine. IMPRESSION: 1. No acute bony findings.  No definite knee effusion. 2. There is likely some prepatellar soft tissue swelling. 3. 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 4. If pain persists despite conservative therapy, MRI may be warranted for further characterization. Electronically Signed   By: Van Clines M.D.   On: 07/29/2018 20:26    Procedures Procedures (including critical care time)  Medications Ordered in ED Medications - No data to display   Initial Impression / Assessment and Plan / ED Course  I have reviewed the triage vital signs and the nursing notes.  Pertinent labs & imaging results that were available during my care of the patient were reviewed by me and considered in my medical decision making (see chart for details).     Patient X-Ray negative for obvious fracture or dislocation. Large contusion of right lower leg. No current indication of compartment syndrome.  Pt advised to follow up with orthopedics. Patient given ace wrap, crutches while in ED, care instructions provided and discussed. Patient will be discharged home & is  agreeable with above plan. Returns precautions discussed. Pt appears safe for discharge.  Final Clinical Impressions(s) / ED Diagnoses   Final diagnoses:  Contusion of right lower leg, initial encounter    ED Discharge Orders    None       Etta Quill, NP 07/29/18 2109    Dorie Rank, MD 07/30/18 [phone]

## 2018-07-29 NOTE — ED Notes (Signed)
See EDP assessment 

## 2020-12-18 ENCOUNTER — Emergency Department (HOSPITAL_COMMUNITY)
Admission: EM | Admit: 2020-12-18 | Discharge: 2020-12-18 | Disposition: A | Discharge status: Home / Self Care | Payer: BC Managed Care – PPO | Attending: Emergency Medicine | Admitting: Emergency Medicine

## 2020-12-18 ENCOUNTER — Emergency Department (HOSPITAL_COMMUNITY): Payer: BC Managed Care – PPO

## 2020-12-18 ENCOUNTER — Encounter (HOSPITAL_COMMUNITY): Payer: Self-pay | Admitting: Emergency Medicine

## 2020-12-18 DIAGNOSIS — D509 Iron deficiency anemia, unspecified: Secondary | ICD-10-CM | POA: Insufficient documentation

## 2020-12-18 DIAGNOSIS — I700 Atherosclerosis of aorta: Secondary | ICD-10-CM | POA: Diagnosis not present

## 2020-12-18 DIAGNOSIS — I10 Essential (primary) hypertension: Secondary | ICD-10-CM | POA: Insufficient documentation

## 2020-12-18 DIAGNOSIS — J90 Pleural effusion, not elsewhere classified: Secondary | ICD-10-CM | POA: Diagnosis not present

## 2020-12-18 DIAGNOSIS — R160 Hepatomegaly, not elsewhere classified: Secondary | ICD-10-CM | POA: Insufficient documentation

## 2020-12-18 DIAGNOSIS — R1010 Upper abdominal pain, unspecified: Secondary | ICD-10-CM | POA: Insufficient documentation

## 2020-12-18 DIAGNOSIS — R197 Diarrhea, unspecified: Secondary | ICD-10-CM | POA: Insufficient documentation

## 2020-12-18 DIAGNOSIS — R079 Chest pain, unspecified: Secondary | ICD-10-CM | POA: Diagnosis not present

## 2020-12-18 DIAGNOSIS — K7689 Other specified diseases of liver: Secondary | ICD-10-CM | POA: Diagnosis not present

## 2020-12-18 DIAGNOSIS — R110 Nausea: Secondary | ICD-10-CM | POA: Diagnosis not present

## 2020-12-18 DIAGNOSIS — Z79899 Other long term (current) drug therapy: Secondary | ICD-10-CM | POA: Diagnosis not present

## 2020-12-18 DIAGNOSIS — M549 Dorsalgia, unspecified: Secondary | ICD-10-CM | POA: Diagnosis not present

## 2020-12-18 DIAGNOSIS — I313 Pericardial effusion (noninflammatory): Secondary | ICD-10-CM | POA: Diagnosis not present

## 2020-12-18 DIAGNOSIS — J9811 Atelectasis: Secondary | ICD-10-CM | POA: Diagnosis not present

## 2020-12-18 LAB — COMPREHENSIVE METABOLIC PANEL
ALT: 16 U/L (ref 0–44)
AST: 11 U/L — ABNORMAL LOW (ref 15–41)
Albumin: 3.6 g/dL (ref 3.5–5.0)
Alkaline Phosphatase: 77 U/L (ref 38–126)
Anion gap: 10 (ref 5–15)
BUN: 10 mg/dL (ref 6–20)
CO2: 27 mmol/L (ref 22–32)
Calcium: 9.3 mg/dL (ref 8.9–10.3)
Chloride: 103 mmol/L (ref 98–111)
Creatinine, Ser: 0.69 mg/dL (ref 0.44–1.00)
GFR, Estimated: >60 mL/min (ref >60)
Glucose, Bld: 110 mg/dL — ABNORMAL HIGH (ref 70–99)
Potassium: 3.5 mmol/L (ref 3.5–5.1)
Sodium: 140 mmol/L (ref 135–145)
Total Bilirubin: 0.5 mg/dL (ref 0.3–1.2)
Total Protein: 7 g/dL (ref 6.5–8.1)

## 2020-12-18 LAB — CBC
HCT: 35.8 % — ABNORMAL LOW (ref 36.0–46.0)
Hemoglobin: 10.8 g/dL — ABNORMAL LOW (ref 12.0–15.0)
MCH: 23.7 pg — ABNORMAL LOW (ref 26.0–34.0)
MCHC: 30.2 g/dL (ref 30.0–36.0)
MCV: 78.5 fL — ABNORMAL LOW (ref 80.0–100.0)
Platelets: 280 10*3/uL (ref 150–400)
RBC: 4.56 MIL/uL (ref 3.87–5.11)
RDW: 17.6 % — ABNORMAL HIGH (ref 11.5–15.5)
WBC: 9.2 10*3/uL (ref 4.0–10.5)
nRBC: 0 % (ref 0.0–0.2)

## 2020-12-18 LAB — I-STAT BETA HCG BLOOD, ED (MC, WL, AP ONLY): I-stat hCG, quantitative: <5 m[IU]/mL (ref <5)

## 2020-12-18 LAB — URINALYSIS, ROUTINE W REFLEX MICROSCOPIC
Bilirubin Urine: NEGATIVE
Glucose, UA: NEGATIVE mg/dL
Hgb urine dipstick: NEGATIVE
Ketones, ur: NEGATIVE mg/dL
Leukocytes,Ua: NEGATIVE
Nitrite: NEGATIVE
Protein, ur: NEGATIVE mg/dL
Specific Gravity, Urine: 1.019 (ref 1.005–1.030)
pH: 7 (ref 5.0–8.0)

## 2020-12-18 LAB — LIPASE, BLOOD: Lipase: 36 U/L (ref 11–51)

## 2020-12-18 LAB — TROPONIN I (HIGH SENSITIVITY): Troponin I (High Sensitivity): 5 ng/L (ref <18)

## 2020-12-18 MED ORDER — LABETALOL HCL 5 MG/ML IV SOLN
5.0000 mg | Freq: Once | INTRAVENOUS | Status: AC
  Administered 2020-12-18: 5 mg via INTRAVENOUS
  Filled 2020-12-18: qty 4

## 2020-12-18 MED ORDER — IOHEXOL 350 MG/ML SOLN
100.0000 mL | Freq: Once | INTRAVENOUS | Status: AC | PRN
  Administered 2020-12-18: 100 mL via INTRAVENOUS

## 2020-12-18 MED ORDER — LOSARTAN POTASSIUM 25 MG PO TABS
50.0000 mg | ORAL_TABLET | Freq: Once | ORAL | Status: AC
  Administered 2020-12-18: 50 mg via ORAL
  Filled 2020-12-18: qty 2

## 2020-12-18 MED ORDER — AMLODIPINE BESYLATE 5 MG PO TABS
5.0000 mg | ORAL_TABLET | Freq: Once | ORAL | Status: AC
  Administered 2020-12-18: 5 mg via ORAL
  Filled 2020-12-18: qty 1

## 2020-12-18 MED ORDER — LOSARTAN POTASSIUM 50 MG PO TABS: 50.0000 mg | ORAL_TABLET | Freq: Every day | ORAL | 0 refills | Status: DC

## 2020-12-18 MED ORDER — AMLODIPINE BESYLATE 5 MG PO TABS: 5.0000 mg | ORAL_TABLET | Freq: Every day | ORAL | 0 refills | Status: AC

## 2020-12-18 NOTE — ED Triage Notes (Signed)
Patient here from home reporting hypertension, nausea, upper abd pain that started 1 week ago. BP meds with no relief. Reports upper abd pain radiating into back.

## 2020-12-18 NOTE — Discharge Instructions (Addendum)
Madison Murillo,   You were seen in the ED for chest tightness, upper back pain, nausea and headache. Your symptoms were likely due to your blood pressure which was as high as 212/125 in the ED. Your blood pressure quickly improved after IV Labetalol, and you were started on Amlodipine 5mg  and Losartan 50mg .   Please take 1 tablet amlodipine and 1 tablet Losartan daily.   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 In either case, we highly encourage you to call [facility] at the number listed above to schedule an appointment at your earliest convenience. I have also provided the number to the [facility] [phone] for you to call to schedule a hospital follow up visit to establish primary care through them.   Please return to the ED if you again develop severe hypertension >180/110, if you have persistent severe headache, chest pain, back pain, or any other concerning symptoms.   Thank you and take care!  Dr. Konrad Penta

## 2020-12-18 NOTE — ED Provider Notes (Signed)
[facility] DEPT Provider Note   CSN: [id number] Arrival date & time: 12/18/20  1706   Chief Complaint: Back Pain  History Chief Complaint  Patient presents with  . Hypertension  . Nausea  . Abdominal Pain  . Back Pain    Madison Murillo is a 45 y.o. female.  HPI   Ms. Madison Murillo is a morbidly obese 45 y.o. lady w/ PMHx hemiplegic migraines, HTN and cholecystectomy, presenting with chief complaint of back pain in the setting of high blood pressure. She states that 3.5 weeks ago, she had a headache typical for her usual hemiplegic migraine. She has had trouble keeping her BP below 299 systolic since that time. Last night, she developed a headache that differed from her typical migraines in that she didn't have any visual aura preceding her headache. Her blood pressure at the time was 204/113 which improved slightly with rest. This morning, she developed nausea that has progressively worsened, and she developed chest tightness with back pain described as a "fist punching the center of her back". She notes her pressure was 223/119 at that time and she experienced dizziness upon standing. Her back pain worsens when she puts pressure on the area. She also notes bilateral upper abdominal discomfort. Did have a couple of loose stools this morning but without blood or melena, and has not vomited. Denies fevers, chills, focal numbness or weakness, or any other symptoms. Denies caffeine use and states she has been trying to eat less salt and exercise more.   Past Medical History:  Diagnosis Date  . Preeclampsia   . Vaginal delivery     There are no problems to display for this patient.   Past Surgical History:  Procedure Laterality Date  . ANKLE SURGERY Left   . CHOLECYSTECTOMY       OB History   No obstetric history on file.    Family Hx: Positive for hemiplegic migraines.   Social History   Tobacco Use  . Smoking status: Never Smoker  . Smokeless  tobacco: Never Used  Vaping Use  . Vaping Use: Never used  Substance Use Topics  . Alcohol use: No  . Drug use: No    Home Medications Prior to Admission medications   Medication Sig Start Date End Date Taking? Authorizing Provider  amLODipine (NORVASC) 5 MG tablet Take 1 tablet (5 mg total) by mouth daily. 12/18/20 01/17/21 Yes Jeralyn Bennett, MD  ibuprofen (ADVIL) 200 MG tablet Take 800 mg by mouth every 6 (six) hours as needed for mild pain.   Yes [provider]  losartan (COZAAR) 50 MG tablet Take 1 tablet (50 mg total) by mouth daily. 12/18/20 01/17/21 Yes Jeralyn Bennett, MD  HYDROcodone-acetaminophen (NORCO/VICODIN) 5-325 MG tablet Take 1 tablet by mouth every 6 (six) hours as needed for severe pain. Patient not taking: No sig reported 07/29/18   Etta Quill, NP  oxyCODONE-acetaminophen (PERCOCET/ROXICET) 5-325 MG tablet Take 1-2 tablets by mouth every 6 (six) hours as needed for severe pain. Patient not taking: Reported on 12/18/2020 12/27/17   Carlisle Cater, PA-C    Allergies    Codeine  Review of Systems   Review of Systems   10-point review of systems otherwise negative except as noted above in HPI.   Physical Exam Updated Vital Signs BP (!) 176/98   Pulse 85   Temp 98.1 F (36.7 C) (Oral)   Resp (!) 21   SpO2 94%   Physical Exam   General: Patient is morbidly  obese. She appears well in no acute distress.  Eyes: Sclera non-icteric. No conjunctival injection.  HENT: MMM. No nasal discharge. Respiratory: Lungs are CTA, bilaterally. No wheezes, rales, or rhonchi.  Cardiovascular: Regular rate and rhythm. She has a 2/6 SEM heard loudest along the right upper sternal border. No other murmurs, rubs, or gallops. There is trace bilateral lower extremity non-pitting edema.  Neurological: Alert and oriented x 3. CN II-XII intact. Strength is 5/5 in all four extremities.  Musculoskeletal: There is moderate tenderness to palpation of the thoracolumbar spine and  paraspinal areas. There is mild anterior chest wall tenderness to palpation.  Abdominal: Obese. Soft and not distended. There is mild diffuse tenderness to palpation without guarding or rebound.  Skin: No lesions. No rashes.  Psych: Normal affect. Normal tone of voice.   ED Results / Procedures / Treatments   Labs (all labs ordered are listed, but only abnormal results are displayed) Labs Reviewed  COMPREHENSIVE METABOLIC PANEL - Abnormal; Notable for the following components:      Result Value   Glucose, Bld 110 (*)    AST 11 (*)    All other components within normal limits  CBC - Abnormal; Notable for the following components:   Hemoglobin 10.8 (*)    HCT 35.8 (*)    MCV 78.5 (*)    MCH 23.7 (*)    RDW 17.6 (*)    All other components within normal limits  URINALYSIS, ROUTINE W REFLEX MICROSCOPIC - Abnormal; Notable for the following components:   APPearance CLOUDY (*)    All other components within normal limits  LIPASE, BLOOD  I-STAT BETA HCG BLOOD, ED ([facility], [facility], [facility] ONLY)  TROPONIN I (HIGH SENSITIVITY)    EKG None   EKG shows NSR at 80bpm with T wave inversions in inferior leads, V5 and V6, without consecutive ST segment changes. Concerning for possible demand ischemia. PR and QTc intervals are normal. Probable LAE.   Radiology CT Angio Chest/Abd/Pel for Dissection W and/or Wo Contrast  Result Date: 12/18/2020 CLINICAL DATA:  Chest pain upper back pain question of dissection EXAM: CT ANGIOGRAPHY CHEST, ABDOMEN AND PELVIS TECHNIQUE: Non-contrast CT of the chest was initially obtained. Multidetector CT imaging through the chest, abdomen and pelvis was performed using the standard protocol during bolus administration of intravenous contrast. Multiplanar reconstructed images and MIPs were obtained and reviewed to evaluate the vascular anatomy. CONTRAST:  114mL OMNIPAQUE IOHEXOL 350 MG/ML SOLN COMPARISON:  None. FINDINGS: CTA CHEST FINDINGS Cardiovascular: --Heart: The heart size is  for.  There is nopericardial effusion. --Aorta: The course and caliber of the thoracic aorta are normal. There is no aortic atherosclerotic calcification. Precontrast images show no aortic intramural hematoma. There is no blood pool, dissection or penetrating ulcer demonstrated on arterial phase postcontrast imaging. There is a conventional 3 vessel aortic arch branching pattern. The proximal arch vessels are widely patent. --Pulmonary Arteries: Contrast timing is optimized for preferential opacification of the aorta. Within that limitation, normal central pulmonary arteries. Mediastinum/Nodes: No mediastinal, hilar or axillary lymphadenopathy. The visualized thyroid and thoracic esophageal course are unremarkable. Lungs/Pleura: No pulmonary nodules or masses. Minimal bibasilar dependent atelectasis is seen. No pleural effusion is noted. No focal airspace consolidation. No focal pleural abnormality. Musculoskeletal: No chest wall abnormality. No acute osseous findings. Review of the MIP images confirms the above findings. CTA ABDOMEN AND PELVIS FINDINGS VASCULAR Aorta: Normal caliber aorta without aneurysm, dissection, vasculitis or hemodynamically significant stenosis. There is scattered minimal aortic atherosclerosis at the aorta  bi-iliac bifurcation. Celiac: No aneurysm, dissection or hemodynamically significant stenosis. Normal branching pattern SMA: Widely patent without dissection or stenosis. Renals: Single renal arteries bilaterally. No aneurysm, dissection, stenosis or evidence of fibromuscular dysplasia. IMA: Patent without abnormality. Inflow: No aneurysm, stenosis or dissection. Veins: Normal course and caliber of the major veins. Assessment is otherwise limited by the arterial dominant contrast phase. Review of the MIP images confirms the above findings. NON-VASCULAR Hepatobiliary: There is mild-to-moderate hepatomegaly. A large hypodense multilocular cystic mass within the entirety of the posterior  right liver the measuring approximately a 15.3 x 13.4 x 21.7 cm. No internal septations or calcifications are noted. No areas of hemorrhage are noted. This appears to be partially present on a prior exam dating back to 2018, however it is significantly grown in size. The patient is status post cholecystectomy. Pancreas: Normal contours without ductal dilatation. No peripancreatic fluid collection. Spleen: Normal arterial phase splenic enhancement pattern. Adrenals/Urinary Tract: --Adrenal glands: Normal. --Right kidney/ureter: The right kidney appears to be displaced inferiorly. No hydronephrosis or perinephric stranding. No nephrolithiasis. No obstructing ureteral stones. --Left kidney/ureter: No hydronephrosis or perinephric stranding. No nephrolithiasis. No obstructing ureteral stones. --Urinary bladder: Unremarkable. Stomach/Bowel: --Stomach/Duodenum: No hiatal hernia or other gastric abnormality. Normal duodenal course and caliber. --Small bowel: No dilatation or inflammation. --Colon: No focal abnormality. Lymphatic:  No abdominal or pelvic lymphadenopathy. Reproductive: No free fluid in the pelvis. Musculoskeletal. No bony spinal canal stenosis or focal osseous abnormality. Other: None. Review of the MIP images confirms the above findings. IMPRESSION: 1. No acute aortic abnormality. 2. Mild aortic atherosclerosis. Aortic Atherosclerosis (ICD10-I70.0). 3. Hepatomegaly with interval significant growth in a large multilocular cystic mass within the right liver lobe measuring 15.3 x 13.4 x 21.7 cm. Given the interval growth since 2018 this could represent a hepatic neoplasm such as cystadenoma, or large complex hepatic cyst. Electronically Signed   By: Prudencio Pair M.D.   On: 12/18/2020 19:43    Procedures Procedures   Medications Ordered in ED Medications  labetalol (NORMODYNE) injection 5 mg (5 mg Intravenous Given 12/18/20 1833)  iohexol (OMNIPAQUE) 350 MG/ML injection 100 mL (100 mLs Intravenous  Contrast Given 12/18/20 1903)  amLODipine (NORVASC) tablet 5 mg (5 mg Oral Given 12/18/20 2104)  losartan (COZAAR) tablet 50 mg (50 mg Oral Given 12/18/20 2104)    ED Course  I have reviewed the triage vital signs and the nursing notes.  Pertinent labs & imaging results that were available during my care of the patient were reviewed by me and considered in my medical decision making (see chart for details).    MDM Rules/Calculators/A&P                          Ms. Klare is a 45 y.o. lady w/ PMHx hemiplegic migraines, HTN, and cholecystectomy presenting with typical CP with radiation between her scapulas as well as diffuse, non-localizable abdominal pain in the setting of severe hypertension with blood pressures in the low 923'R systolic. She also has headache that is atypical from her usual migraines and nausea / increased stool frequency. Concerned for hypertensive emergency vs. Dissection vs. ACS vs. Pancreatitis vs. Other musculoskeletal cause of CP and back pain, although less likely without history of notable trauma.   Plan:   - Check EKG - Check CBC, CMP, troponin I, lipase, beta-HCG, urinalysis  - Check CTA chest, abdomen, pelvis for dissection  - Patient made NPO except meds - Give labetalol 5mg  once for  BP; continue close monitoring   Results:   - Labs consistent with stable microcytic anemia with Hgb 10.8, MCV 78.5.  - Troponin 5. HCG negative, CMP unremarkable with normal lipase and unremarkable U/A. - CTA chest/abdomen/pelvis ruled out significant vascular stenosis, aneurysm, or dissection. Patient found to have hepatomegaly with interval significant growth since 2018 of a large multilocular cystic mass within the R lobe of the liver measuring 15.3 x 13.4 x 21.7cm. Radiology suggest this could represent a hepatic neoplasm such as cystadenoma or large complex hepatic cyst.   Blood pressure significantly improved to 164/102 with labetalol 5mg  IV x 1. She does not take any  antihypertensives at home, although notes her blood pressure is usually elevated up to the 818'H systolic. Patient is stable for discharge with PCP and GI follow up. Will give information to schedule follow up at [facility] and with Dr. Therisa Doyne with [facility] for evaluation of enlarging R lobe cystic mass. Will prescribe amlodipine 5mg  and Losartan 50mg  daily and provide return precautions.   Final Clinical Impression(s) / ED Diagnoses Final diagnoses:  Microcytic anemia  Mass of right lobe of liver  Severe hypertension    Rx / DC Orders ED Discharge Orders         Ordered    amLODipine (NORVASC) 5 MG tablet  Daily        12/18/20 2108    losartan (COZAAR) 50 MG tablet  Daily        12/18/20 2108         Jeralyn Bennett, MD 12/18/2020, 9:48 PM Pager: [phone]    Jeralyn Bennett, MD 12/18/20 2148    Carmin Muskrat, MD 12/20/20 [phone]

## 2020-12-23 DIAGNOSIS — R160 Hepatomegaly, not elsewhere classified: Secondary | ICD-10-CM | POA: Diagnosis not present

## 2020-12-23 DIAGNOSIS — Z01818 Encounter for other preprocedural examination: Secondary | ICD-10-CM | POA: Diagnosis not present

## 2020-12-28 DIAGNOSIS — K7689 Other specified diseases of liver: Secondary | ICD-10-CM | POA: Diagnosis not present

## 2020-12-29 ENCOUNTER — Other Ambulatory Visit (HOSPITAL_COMMUNITY): Payer: Self-pay | Admitting: Surgery

## 2020-12-29 ENCOUNTER — Other Ambulatory Visit: Payer: Self-pay | Admitting: Surgery

## 2020-12-29 DIAGNOSIS — Q446 Cystic disease of liver: Secondary | ICD-10-CM

## 2021-01-02 ENCOUNTER — Other Ambulatory Visit: Payer: Self-pay

## 2021-01-02 ENCOUNTER — Ambulatory Visit (HOSPITAL_COMMUNITY)
Admission: RE | Admit: 2021-01-02 | Discharge: 2021-01-02 | Disposition: A | Discharge status: Home / Self Care | Payer: BC Managed Care – PPO | Source: Ambulatory Visit | Attending: Surgery | Admitting: Surgery

## 2021-01-02 DIAGNOSIS — K7689 Other specified diseases of liver: Secondary | ICD-10-CM | POA: Diagnosis not present

## 2021-01-02 DIAGNOSIS — Z9049 Acquired absence of other specified parts of digestive tract: Secondary | ICD-10-CM | POA: Diagnosis not present

## 2021-01-02 DIAGNOSIS — Q446 Cystic disease of liver: Secondary | ICD-10-CM | POA: Insufficient documentation

## 2021-01-02 MED ORDER — GADOBUTROL 1 MMOL/ML IV SOLN
10.0000 mL | Freq: Once | INTRAVENOUS | Status: AC | PRN
  Administered 2021-01-02: 10 mL via INTRAVENOUS

## 2021-01-04 ENCOUNTER — Other Ambulatory Visit: Payer: Self-pay

## 2021-01-04 NOTE — Progress Notes (Signed)
The proposed treatment discussed in conference is for discussion purposes only and is not a binding recommendation.  The patients have not been physically examined, or presented with their treatment options.  Therefore, final treatment plans cannot be decided.   

## 2021-02-03 DIAGNOSIS — R110 Nausea: Secondary | ICD-10-CM | POA: Diagnosis not present

## 2021-02-03 DIAGNOSIS — K7689 Other specified diseases of liver: Secondary | ICD-10-CM | POA: Diagnosis not present

## 2021-02-03 DIAGNOSIS — R1011 Right upper quadrant pain: Secondary | ICD-10-CM | POA: Diagnosis not present

## 2021-02-03 DIAGNOSIS — I10 Essential (primary) hypertension: Secondary | ICD-10-CM | POA: Diagnosis not present

## 2021-02-07 DIAGNOSIS — K7689 Other specified diseases of liver: Secondary | ICD-10-CM | POA: Diagnosis not present

## 2021-02-28 DIAGNOSIS — R112 Nausea with vomiting, unspecified: Secondary | ICD-10-CM | POA: Diagnosis not present

## 2021-02-28 DIAGNOSIS — R1011 Right upper quadrant pain: Secondary | ICD-10-CM | POA: Diagnosis not present

## 2021-02-28 DIAGNOSIS — Z9049 Acquired absence of other specified parts of digestive tract: Secondary | ICD-10-CM | POA: Diagnosis not present

## 2021-02-28 DIAGNOSIS — K7689 Other specified diseases of liver: Secondary | ICD-10-CM | POA: Diagnosis not present

## 2021-02-28 DIAGNOSIS — I10 Essential (primary) hypertension: Secondary | ICD-10-CM | POA: Diagnosis not present

## 2021-02-28 DIAGNOSIS — Z6841 Body Mass Index (BMI) 40.0 and over, adult: Secondary | ICD-10-CM | POA: Diagnosis not present

## 2021-02-28 DIAGNOSIS — R978 Other abnormal tumor markers: Secondary | ICD-10-CM | POA: Diagnosis not present

## 2021-03-02 DIAGNOSIS — I10 Essential (primary) hypertension: Secondary | ICD-10-CM | POA: Diagnosis not present

## 2021-03-02 DIAGNOSIS — R160 Hepatomegaly, not elsewhere classified: Secondary | ICD-10-CM | POA: Diagnosis not present

## 2021-03-02 DIAGNOSIS — R112 Nausea with vomiting, unspecified: Secondary | ICD-10-CM | POA: Diagnosis not present

## 2021-03-02 DIAGNOSIS — R1011 Right upper quadrant pain: Secondary | ICD-10-CM | POA: Diagnosis not present

## 2021-03-09 DIAGNOSIS — I10 Essential (primary) hypertension: Secondary | ICD-10-CM | POA: Diagnosis not present

## 2021-03-09 DIAGNOSIS — Z5181 Encounter for therapeutic drug level monitoring: Secondary | ICD-10-CM | POA: Diagnosis not present

## 2021-03-09 DIAGNOSIS — Z6841 Body Mass Index (BMI) 40.0 and over, adult: Secondary | ICD-10-CM | POA: Diagnosis not present

## 2021-03-15 DIAGNOSIS — R1011 Right upper quadrant pain: Secondary | ICD-10-CM | POA: Diagnosis not present

## 2021-03-15 DIAGNOSIS — I10 Essential (primary) hypertension: Secondary | ICD-10-CM | POA: Diagnosis not present

## 2021-03-15 DIAGNOSIS — R112 Nausea with vomiting, unspecified: Secondary | ICD-10-CM | POA: Diagnosis not present

## 2021-03-15 DIAGNOSIS — R160 Hepatomegaly, not elsewhere classified: Secondary | ICD-10-CM | POA: Diagnosis not present

## 2021-03-27 ENCOUNTER — Ambulatory Visit: Payer: BLUE CROSS/BLUE SHIELD | Admitting: Emergency Medicine

## 2021-03-27 DIAGNOSIS — Z0289 Encounter for other administrative examinations: Secondary | ICD-10-CM

## 2021-03-28 ENCOUNTER — Other Ambulatory Visit: Payer: Self-pay | Admitting: Gastroenterology

## 2021-03-28 DIAGNOSIS — R160 Hepatomegaly, not elsewhere classified: Secondary | ICD-10-CM | POA: Diagnosis not present

## 2021-03-28 DIAGNOSIS — K219 Gastro-esophageal reflux disease without esophagitis: Secondary | ICD-10-CM | POA: Diagnosis not present

## 2021-03-28 DIAGNOSIS — R112 Nausea with vomiting, unspecified: Secondary | ICD-10-CM | POA: Diagnosis not present

## 2021-03-29 ENCOUNTER — Other Ambulatory Visit: Payer: Self-pay | Admitting: Gastroenterology

## 2021-04-03 NOTE — Progress Notes (Signed)
Attempted to obtain medical history via telephone, unable to reach at this time. I left a voicemail to return pre surgical testing department's phone call.  

## 2021-04-05 NOTE — H&P (Signed)
History of Present Illness  General:          45 year old morbidly obese female (BMI over 68) female with history of cholecystectomy in 2000, hypertension, migraines presents for follow-up for liver masses needing clearance for surgery.        Patient last seen by Dr. Therisa Doyne 12/23/2020 for ER follow-up.        That time CT of chest abdomen and pelvis showed large hyper dense, multi locular, cystic mass in the posterior right liver measuring 15.3 by 13.4 by 21.7 centimeters.        That time she was having hardening on right upper quadrant, exhaustion, weight loss, nausea and vomiting.        She was sent to Dr. Michaelle Birks nonurgent referral hepatobiliary surgeon [facility], however she felt more comfortable with treatment at [facility] and is currently seeing, Dr. Larina Bras.         She was seen there in 02/28/2021, with a plan of laparoscopic cyst fenestration.         Patient had elevated blood pressure at that time, 178/110 and needed to follow-up with PCP.         She is seeing Dr. Hulan Saas at [facility] and her BP has improved, 130/70's today and monitors at home, migraines are more 2 a month and have decreased with improved BP.         The surgeon also wanted evaluation for nausea and vomiting.         Has improved with BP medication change.         She has gone gluten free and trying to lose weight. Cutting out gluten has helped her eczema, throat fullness has improved. TTG and IGA negative.         She had gluten free pizza today and had nausea and AB pain with this, had pizza with very little cheese and did better. So she is cutting out all lactose.         She has started a very strict diet keeping a food diary of foods that cause nausea and stopped those foods.         She has not had any vomiting in 2 weeks, has had nausea at a minimum with these changes.         She has decreased hunger or states it is non existent but will have lightheaded and had 1 presyncopal episode when not eating  a month ago.        She does have reflux for last 3-4 years, can be unprovoked, GERD has been worse in last 2-3 weeks.        No reflux at night. No regurgitation.         Use to be able to help with tums but that is not helpful now.         NSAIDS once a week, no ETOH.         No more RUQ pain. No black stool.         She denies dysphagia, melena.         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    Current Medications  Taking  Ondansetron 4 MG Tablet Disintegrating 1 tablet on the tongue and allow to dissolve Orally Once a day Hydrochlorothiazide-12.5 mg 12.5 mg Tablet one tablet Orally once a day Lisinopril 20 MG Tablet 1 tablet Orally Once a day amLODIPine Besylate 5 MG Tablet 1 tablet Orally twice a day  Not-Taking  Losartan Potassium 50 MG Tablet 1 tablet Orally Once a day Medication List reviewed and reconciled with the patient     Past Medical History       HTN.      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   Surgical History        gallbladder removed 04/08/1999        Tonsilectomy         wisdom teeth removed       Family History  Father: deceased, diagnosed with Diabetes  Mother: deceased  Maternal Grand Mother: polyps   Father passedfrom large b cell lymphoma  no family hx of colon ca, or liver dx.       Social History  General:   Tobacco use  cigarettes:  Never smoked, Tobacco history last updated  03/28/2021, Vaping  No. no EXPOSURE TO PASSIVE SMOKE. no Alcohol, in the past. Caffeine: None. no Recreational drug use. DIET: making changes to her diet , limiting sugars , low carb. Exercise: intermittent, cardio, nothing structured.      Allergies  Codeine: hallucination/rash - Allergy  jicama: anaphylaxis - Allergy       Hospitalization/Major Diagnostic Procedure  Blood pressure /CAT scan for abd andbackpain 12/20/2020  none since 12/2020 03/2021       Review of Systems  GI PROCEDURE:          Pacemaker/ AICD no.  Artificial heart valves no.  MI/heart attack no.  Abnormal heart  rhythm no.  Angina no.  CVA no.  Hypertension YES.  Hypotension no.  Asthma, COPD no.  Sleep apnea no.  Seizure disorders no.  Artificial joints no.  Severe DJD no.  Diabetes no.  Significant headaches YES.  Vertigo YES.  Depression/anxiety no.  Abnormal bleeding no.  Kidney Disease no.  Liver disease no.  Chance of pregnancy no.  Blood transfusion YES- 1982.           Vital Signs  Wt 319, Ht 65, BMI 53.08, Temp 97.3, Pulse sitting 72, BP sitting 134/73.     Examination  Gastroenterology::        GENERAL APPEARANCE: Well developed, morbidly obese, no active distress, pleasant. SCLERA: anicteric. CARDIOVASCULAR Normal RRR . RESPIRATORY Breath sounds normal. Respiration even and unlabored. ABDOMEN No masses palpated. Liver and spleen not palpated, normal. Bowel sounds normal, Abdomen not distended, RUQ tendernesss. EXTREMITIES: No edema. NEURO: alert, oriented to time, place and person, normal gait. PSYCH: mood/affect normal.      Assessments     1. Nausea and vomiting, unspecified vomiting type - R11.2   2. GERD without esophagitis - K21.9   3. Morbidly obese - E66.01   4. Liver mass, right lobe - R16.0     Treatment   1. Nausea and vomiting, unspecified vomiting type         IMAGING: EGD Esophagogastroduodenoscopy       Clinical Notes: patient with nausea and vomiting, has improved some with controlled blood pressure, which has helped potentially decrease migraines, patient has also been very strict with her diet which has helped some. However with upcoming surgery, will schedule for endoscopy to rule out other etiologies. also still suggest treatment with migraines if nausea and vomiting do not improve after endoscopy and laparoscopic cyst fenestration.      2. GERD without esophagitis   Clinical Notes: schedule for endoscopy in the hospital to evaluate I discussed risks of EGD with patient today, including risk of sedation, bleeding or perforation. Patient expressed understanding  and agrees to proceed with procedure.      3. Morbidly obese   Clinical Notes: will perform in the hospital.      4. Liver mass, right lobe   Clinical Notes: following with [facility], Dr. Larina Bras.

## 2021-04-05 NOTE — Anesthesia Preprocedure Evaluation (Addendum)
Anesthesia Evaluation  Patient identified by MRN, date of birth, ID band Patient awake    Reviewed: Allergy & Precautions, NPO status , Patient's Chart, lab work & pertinent test results  Airway Mallampati: II  TM Distance: >3 FB Neck ROM: Full    Dental no notable dental hx. (+) Teeth Intact, Dental Advisory Given   Pulmonary neg pulmonary ROS,    Pulmonary exam normal breath sounds clear to auscultation       Cardiovascular hypertension, Pt. on medications Normal cardiovascular exam Rhythm:Regular Rate:Normal  2/22  EKG SR R 80 NSST changes   Neuro/Psych  Headaches,    GI/Hepatic Neg liver ROS,   Endo/Other  negative endocrine ROS  Renal/GU negative Renal ROS     Musculoskeletal negative musculoskeletal ROS (+)   Abdominal (+) + obese,   Peds  Hematology   Anesthesia Other Findings   Reproductive/Obstetrics                            Anesthesia Physical Anesthesia Plan  ASA: 3  Anesthesia Plan: MAC   Post-op Pain Management:    Induction:   PONV Risk Score and Plan: Treatment may vary due to age or medical condition  Airway Management Planned: Natural Airway  Additional Equipment: None  Intra-op Plan:   Post-operative Plan:   Informed Consent: I have reviewed the patients History and Physical, chart, labs and discussed the procedure including the risks, benefits and alternatives for the proposed anesthesia with the patient or authorized representative who has indicated his/her understanding and acceptance.     Dental advisory given  Plan Discussed with: CRNA and Anesthesiologist  Anesthesia Plan Comments: (Hx of liver tumor wt loss Screening EGD)       Anesthesia Quick Evaluation

## 2021-04-06 ENCOUNTER — Encounter (HOSPITAL_COMMUNITY): Payer: Self-pay | Admitting: Gastroenterology

## 2021-04-06 ENCOUNTER — Ambulatory Visit (HOSPITAL_COMMUNITY): Payer: BC Managed Care – PPO | Admitting: Anesthesiology

## 2021-04-06 ENCOUNTER — Encounter (HOSPITAL_COMMUNITY)
Admission: RE | Disposition: A | Discharge status: Home / Self Care | Payer: Self-pay | Source: Home / Self Care | Attending: Gastroenterology

## 2021-04-06 ENCOUNTER — Ambulatory Visit (HOSPITAL_COMMUNITY)
Admission: RE | Admit: 2021-04-06 | Discharge: 2021-04-06 | Disposition: A | Discharge status: Home / Self Care | Payer: BC Managed Care – PPO | Attending: Gastroenterology | Admitting: Gastroenterology

## 2021-04-06 ENCOUNTER — Other Ambulatory Visit: Payer: Self-pay

## 2021-04-06 DIAGNOSIS — Z9049 Acquired absence of other specified parts of digestive tract: Secondary | ICD-10-CM | POA: Insufficient documentation

## 2021-04-06 DIAGNOSIS — Z79899 Other long term (current) drug therapy: Secondary | ICD-10-CM | POA: Diagnosis not present

## 2021-04-06 DIAGNOSIS — I10 Essential (primary) hypertension: Secondary | ICD-10-CM | POA: Diagnosis not present

## 2021-04-06 DIAGNOSIS — R6881 Early satiety: Secondary | ICD-10-CM | POA: Insufficient documentation

## 2021-04-06 DIAGNOSIS — Z139 Encounter for screening, unspecified: Secondary | ICD-10-CM | POA: Diagnosis not present

## 2021-04-06 DIAGNOSIS — K2289 Other specified disease of esophagus: Secondary | ICD-10-CM | POA: Insufficient documentation

## 2021-04-06 DIAGNOSIS — Z885 Allergy status to narcotic agent status: Secondary | ICD-10-CM | POA: Diagnosis not present

## 2021-04-06 DIAGNOSIS — K3189 Other diseases of stomach and duodenum: Secondary | ICD-10-CM | POA: Diagnosis not present

## 2021-04-06 DIAGNOSIS — K219 Gastro-esophageal reflux disease without esophagitis: Secondary | ICD-10-CM | POA: Diagnosis not present

## 2021-04-06 DIAGNOSIS — Z6841 Body Mass Index (BMI) 40.0 and over, adult: Secondary | ICD-10-CM | POA: Insufficient documentation

## 2021-04-06 DIAGNOSIS — K2980 Duodenitis without bleeding: Secondary | ICD-10-CM | POA: Insufficient documentation

## 2021-04-06 DIAGNOSIS — R160 Hepatomegaly, not elsewhere classified: Secondary | ICD-10-CM | POA: Diagnosis not present

## 2021-04-06 DIAGNOSIS — R112 Nausea with vomiting, unspecified: Secondary | ICD-10-CM | POA: Insufficient documentation

## 2021-04-06 HISTORY — PX: BIOPSY: SHX5522

## 2021-04-06 HISTORY — PX: ESOPHAGOGASTRODUODENOSCOPY (EGD) WITH PROPOFOL: SHX5813

## 2021-04-06 SURGERY — ESOPHAGOGASTRODUODENOSCOPY (EGD) WITH PROPOFOL
Anesthesia: Monitor Anesthesia Care

## 2021-04-06 MED ORDER — PROPOFOL 500 MG/50ML IV EMUL
INTRAVENOUS | Status: DC | PRN
  Administered 2021-04-06: 150 ug/kg/min via INTRAVENOUS

## 2021-04-06 MED ORDER — SODIUM CHLORIDE 0.9 % IV SOLN: INTRAVENOUS | Status: DC

## 2021-04-06 MED ORDER — PROPOFOL 10 MG/ML IV BOLUS
INTRAVENOUS | Status: DC | PRN
  Administered 2021-04-06: 20 mg via INTRAVENOUS
  Administered 2021-04-06: 30 mg via INTRAVENOUS
  Administered 2021-04-06: 20 mg via INTRAVENOUS

## 2021-04-06 MED ORDER — LIDOCAINE 2% (20 MG/ML) 5 ML SYRINGE
INTRAMUSCULAR | Status: DC | PRN
  Administered 2021-04-06: 100 mg via INTRAVENOUS

## 2021-04-06 MED ORDER — LACTATED RINGERS IV SOLN
INTRAVENOUS | Status: AC | PRN
  Administered 2021-04-06: 10 mL/h via INTRAVENOUS

## 2021-04-06 MED ORDER — LACTATED RINGERS IV SOLN: INTRAVENOUS | Status: DC | PRN

## 2021-04-06 MED ORDER — GLYCOPYRROLATE PF 0.2 MG/ML IJ SOSY
PREFILLED_SYRINGE | INTRAMUSCULAR | Status: DC | PRN
  Administered 2021-04-06: .2 mg via INTRAVENOUS

## 2021-04-06 SURGICAL SUPPLY — 14 items

## 2021-04-06 NOTE — Discharge Instructions (Signed)
YOU HAD AN ENDOSCOPIC PROCEDURE TODAY: Refer to the procedure report and other information in the discharge instructions given to you for any specific questions about what was found during the examination. If this information does not answer your questions, please call [facility] office at [phone] to clarify.   YOU SHOULD EXPECT: Some feelings of bloating in the abdomen. Passage of more gas than usual. Walking can help get rid of the air that was put into your GI tract during the procedure and reduce the bloating. If you had a lower endoscopy (such as a colonoscopy or flexible sigmoidoscopy) you may notice spotting of blood in your stool or on the toilet paper. Some abdominal soreness may be present for a day or two, also.  DIET: Your first meal following the procedure should be a light meal and then it is ok to progress to your normal diet. A half-sandwich or bowl of soup is an example of a good first meal. Heavy or fried foods are harder to digest and may make you feel nauseous or bloated. Drink plenty of fluids but you should avoid alcoholic beverages for 24 hours. If you had a esophageal dilation, please see attached instructions for diet.    ACTIVITY: Your care partner should take you home directly after the procedure. You should plan to take it easy, moving slowly for the rest of the day. You can resume normal activity the day after the procedure however YOU SHOULD NOT DRIVE, use power tools, machinery or perform tasks that involve climbing or major physical exertion for 24 hours (because of the sedation medicines used during the test).   SYMPTOMS TO REPORT IMMEDIATELY: A gastroenterologist can be reached at any hour. Please call [phone]  for any of the following symptoms:  Following upper endoscopy (EGD, EUS, ERCP, esophageal dilation) Vomiting of blood or coffee ground material  New, significant abdominal pain  New, significant chest pain or pain under the shoulder blades  Painful or  persistently difficult swallowing  New shortness of breath  Black, tarry-looking or red, bloody stools  FOLLOW UP:  If any biopsies were taken you will be contacted by phone or by letter within the next 1-3 weeks. Call [phone]  if you have not heard about the biopsies in 3 weeks.  Please also call with any specific questions about appointments or follow up tests. YOU HAD AN ENDOSCOPIC PROCEDURE TODAY: Refer to the procedure report and other information in the discharge instructions given to you for any specific questions about what was found during the examination. If this information does not answer your questions, please call [facility] office at [phone] to clarify.   

## 2021-04-06 NOTE — Transfer of Care (Signed)
Immediate Anesthesia Transfer of Care Note  Patient: Madison Murillo  Procedure(s) Performed: ESOPHAGOGASTRODUODENOSCOPY (EGD) WITH PROPOFOL BIOPSY  Patient Location: PACU and Endoscopy Unit  Anesthesia Type:MAC  Level of Consciousness: awake, alert  and oriented  Airway & Oxygen Therapy: Patient Spontanous Breathing and Patient connected to face mask  Post-op Assessment: Report given to RN and Post -op Vital signs reviewed and stable  Post vital signs: Reviewed and stable  Last Vitals:  Vitals Value Taken Time  BP    Temp    Pulse    Resp    SpO2      Last Pain:  Vitals:   04/06/21 1215  TempSrc: Oral  PainSc: 0-No pain         Complications: No notable events documented.

## 2021-04-06 NOTE — Anesthesia Postprocedure Evaluation (Signed)
Anesthesia Post Note  Patient: Madison Murillo  Procedure(s) Performed: ESOPHAGOGASTRODUODENOSCOPY (EGD) WITH PROPOFOL BIOPSY     Patient location during evaluation: Endoscopy Anesthesia Type: MAC Level of consciousness: awake and alert Pain management: pain level controlled Vital Signs Assessment: post-procedure vital signs reviewed and stable Respiratory status: spontaneous breathing, nonlabored ventilation, respiratory function stable and patient connected to nasal cannula oxygen Cardiovascular status: blood pressure returned to baseline and stable Postop Assessment: no apparent nausea or vomiting Anesthetic complications: no   No notable events documented.  Last Vitals:  Vitals:   04/06/21 1330 04/06/21 1340  BP: 138/84 135/78  Pulse: 85 69  Resp: 19 13  Temp:    SpO2: 98% 99%    Last Pain:  Vitals:   04/06/21 1340  TempSrc:   PainSc: 0-No pain                 Barnet Glasgow

## 2021-04-06 NOTE — Op Note (Signed)
[facility] Patient Name: Madison Murillo Procedure Date: 04/06/2021 MRN: [id number] Attending MD: Ronnette Juniper , MD Date of Birth: 08-Apr-1976 CSN: [id number] Age: 45 Admit Type: Outpatient Procedure:                Upper GI endoscopy Indications:              Early satiety, Nausea with vomiting Providers:                Ronnette Juniper, MD, Nelia Shi, RN, Fransico Setters                            Mbumina, Technician Referring MD:             Dorice Lamas, Dr.Rivindra-[facility] Medicines:                Monitored Anesthesia Care Complications:            No immediate complications. Estimated blood loss:                            Minimal. Estimated Blood Loss:     Estimated blood loss was minimal. Procedure:                Pre-Anesthesia Assessment:                           - Prior to the procedure, a History and Physical                            was performed, and patient medications and                            allergies were reviewed. The patient's tolerance of                            previous anesthesia was also reviewed. The risks                            and benefits of the procedure and the sedation                            options and risks were discussed with the patient.                            All questions were answered, and informed consent                            was obtained. Prior Anticoagulants: The patient has                            taken no previous anticoagulant or antiplatelet                            agents. ASA Grade Assessment: III - A patient with  severe systemic disease. After reviewing the risks                            and benefits, the patient was deemed in                            satisfactory condition to undergo the procedure.                           After obtaining informed consent, the endoscope was                            passed under direct vision. Throughout the                             procedure, the patient's blood pressure, pulse, and                            oxygen saturations were monitored continuously. The                            GIF-H190 ([id number]) Olympus gastroscope was                            introduced through the mouth, and advanced to the                            second part of duodenum. The upper GI endoscopy was                            accomplished without difficulty. The patient                            tolerated the procedure well. Scope In: Scope Out: Findings:      The examined esophagus was normal.      The upper third of the esophagus, middle third of the esophagus and       lower third of the esophagus were normal.      Localized mild mucosal changes characterized by nodularity were found at       the gastroesophageal junction. Biopsies were taken with a cold forceps       for histology.      Bilious fluid was found in the cardia, in the gastric fundus and in the       gastric body.      It was difficult to insufflate the stomach.      Localized mildly erythematous mucosa without bleeding was found in the       gastric antrum. Biopsies were taken with a cold forceps for Helicobacter       pylori testing.      The cardia and gastric fundus were normal on retroflexion.      The examined duodenum was normal. Biopsies for histology were taken with       a cold forceps for evaluation of celiac disease. Impression:               - Normal esophagus.                           -  Normal upper third of esophagus, middle third of                            esophagus and lower third of esophagus.                           - Nodular mucosa in the esophagus. Biopsied.                           - Bilious gastric fluid.                           - Erythematous mucosa in the antrum. Biopsied.                           - Normal examined duodenum. Biopsied. Moderate Sedation:      Patient did not receive moderate sedation for this  procedure, but       instead received monitored anesthesia care. Recommendation:           - Patient has a contact number available for                            emergencies. The signs and symptoms of potential                            delayed complications were discussed with the                            patient. Return to normal activities tomorrow.                            Written discharge instructions were provided to the                            patient.                           - Resume regular diet.                           - Continue present medications.                           - Await pathology results. Procedure Code(s):        --- Professional ---                           [phone], Esophagogastroduodenoscopy, flexible,                            transoral; with biopsy, single or multiple Diagnosis Code(s):        --- Professional ---                           K22.8, Other specified diseases of esophagus  K31.89, Other diseases of stomach and duodenum                           R68.81, Early satiety                           R11.2, Nausea with vomiting, unspecified CPT copyright 2019 [employer]. All rights reserved. The codes documented in this report are preliminary and upon coder review may  be revised to meet current compliance requirements. Ronnette Juniper, MD 04/06/2021 1:10:12 PM This report has been signed electronically. Number of Addenda: 0

## 2021-04-07 LAB — SURGICAL PATHOLOGY

## 2021-04-14 DIAGNOSIS — Z01818 Encounter for other preprocedural examination: Secondary | ICD-10-CM | POA: Diagnosis not present

## 2021-04-14 DIAGNOSIS — R9431 Abnormal electrocardiogram [ECG] [EKG]: Secondary | ICD-10-CM | POA: Diagnosis not present

## 2021-04-14 DIAGNOSIS — Z130 Encounter for screening for diseases of the blood and blood-forming organs and certain disorders involving the immune mechanism: Secondary | ICD-10-CM | POA: Diagnosis not present

## 2021-04-14 DIAGNOSIS — Z79899 Other long term (current) drug therapy: Secondary | ICD-10-CM | POA: Diagnosis not present

## 2021-04-14 DIAGNOSIS — E039 Hypothyroidism, unspecified: Secondary | ICD-10-CM | POA: Diagnosis not present

## 2021-04-14 DIAGNOSIS — I10 Essential (primary) hypertension: Secondary | ICD-10-CM | POA: Diagnosis not present

## 2021-04-21 DIAGNOSIS — Z03818 Encounter for observation for suspected exposure to other biological agents ruled out: Secondary | ICD-10-CM | POA: Diagnosis not present

## 2021-05-02 ENCOUNTER — Emergency Department (HOSPITAL_COMMUNITY)
Admission: EM | Admit: 2021-05-02 | Discharge: 2021-05-03 | Disposition: A | Discharge status: Home / Self Care | Payer: BC Managed Care – PPO | Attending: Emergency Medicine | Admitting: Emergency Medicine

## 2021-05-02 ENCOUNTER — Other Ambulatory Visit: Payer: Self-pay

## 2021-05-02 ENCOUNTER — Emergency Department (HOSPITAL_COMMUNITY): Payer: BC Managed Care – PPO

## 2021-05-02 ENCOUNTER — Encounter (HOSPITAL_COMMUNITY): Payer: Self-pay | Admitting: Emergency Medicine

## 2021-05-02 DIAGNOSIS — I10 Essential (primary) hypertension: Secondary | ICD-10-CM | POA: Insufficient documentation

## 2021-05-02 DIAGNOSIS — M7989 Other specified soft tissue disorders: Secondary | ICD-10-CM | POA: Insufficient documentation

## 2021-05-02 DIAGNOSIS — R1011 Right upper quadrant pain: Secondary | ICD-10-CM | POA: Diagnosis not present

## 2021-05-02 DIAGNOSIS — Q446 Cystic disease of liver: Secondary | ICD-10-CM | POA: Diagnosis not present

## 2021-05-02 DIAGNOSIS — R109 Unspecified abdominal pain: Secondary | ICD-10-CM | POA: Diagnosis not present

## 2021-05-02 DIAGNOSIS — Z79899 Other long term (current) drug therapy: Secondary | ICD-10-CM | POA: Diagnosis not present

## 2021-05-02 DIAGNOSIS — K7689 Other specified diseases of liver: Secondary | ICD-10-CM | POA: Diagnosis not present

## 2021-05-02 LAB — COMPREHENSIVE METABOLIC PANEL
ALT: 14 U/L (ref 0–44)
AST: 8 U/L — ABNORMAL LOW (ref 15–41)
Albumin: 3.6 g/dL (ref 3.5–5.0)
Alkaline Phosphatase: 75 U/L (ref 38–126)
Anion gap: 7 (ref 5–15)
BUN: 13 mg/dL (ref 6–20)
CO2: 26 mmol/L (ref 22–32)
Calcium: 8.8 mg/dL — ABNORMAL LOW (ref 8.9–10.3)
Chloride: 104 mmol/L (ref 98–111)
Creatinine, Ser: 0.63 mg/dL (ref 0.44–1.00)
GFR, Estimated: >60 mL/min (ref >60)
Glucose, Bld: 94 mg/dL (ref 70–99)
Potassium: 3.9 mmol/L (ref 3.5–5.1)
Sodium: 137 mmol/L (ref 135–145)
Total Bilirubin: 0.5 mg/dL (ref 0.3–1.2)
Total Protein: 7 g/dL (ref 6.5–8.1)

## 2021-05-02 LAB — CBC
HCT: 34.4 % — ABNORMAL LOW (ref 36.0–46.0)
Hemoglobin: 10.5 g/dL — ABNORMAL LOW (ref 12.0–15.0)
MCH: 24.1 pg — ABNORMAL LOW (ref 26.0–34.0)
MCHC: 30.5 g/dL (ref 30.0–36.0)
MCV: 79.1 fL — ABNORMAL LOW (ref 80.0–100.0)
Platelets: 335 10*3/uL (ref 150–400)
RBC: 4.35 MIL/uL (ref 3.87–5.11)
RDW: 17.1 % — ABNORMAL HIGH (ref 11.5–15.5)
WBC: 9.8 10*3/uL (ref 4.0–10.5)
nRBC: 0 % (ref 0.0–0.2)

## 2021-05-02 LAB — URINALYSIS, ROUTINE W REFLEX MICROSCOPIC
Bilirubin Urine: NEGATIVE
Glucose, UA: NEGATIVE mg/dL
Hgb urine dipstick: NEGATIVE
Ketones, ur: NEGATIVE mg/dL
Nitrite: NEGATIVE
Protein, ur: NEGATIVE mg/dL
Specific Gravity, Urine: 1.024 (ref 1.005–1.030)
pH: 6 (ref 5.0–8.0)

## 2021-05-02 LAB — I-STAT BETA HCG BLOOD, ED (MC, WL, AP ONLY): I-stat hCG, quantitative: <5 m[IU]/mL (ref <5)

## 2021-05-02 LAB — LIPASE, BLOOD: Lipase: 38 U/L (ref 11–51)

## 2021-05-02 MED ORDER — SODIUM CHLORIDE (PF) 0.9 % IJ SOLN
INTRAMUSCULAR | Status: AC
  Filled 2021-05-02: qty 50

## 2021-05-02 MED ORDER — MORPHINE SULFATE (PF) 4 MG/ML IV SOLN
4.0000 mg | Freq: Once | INTRAVENOUS | Status: AC
  Administered 2021-05-02: 4 mg via INTRAVENOUS
  Filled 2021-05-02: qty 1

## 2021-05-02 MED ORDER — ONDANSETRON HCL 4 MG/2ML IJ SOLN
4.0000 mg | Freq: Once | INTRAMUSCULAR | Status: AC
  Administered 2021-05-02: 4 mg via INTRAVENOUS
  Filled 2021-05-02: qty 2

## 2021-05-02 NOTE — ED Provider Notes (Signed)
Emergency Medicine Provider Triage Evaluation Note  Madison Murillo , a 45 y.o. female  was evaluated in triage.  Pt complains of right upper quadrant pain.  She is status postcholecystectomy and notes a right-sided hepatic cyst.  She is seen at [facility] for this.  She is scheduled to have this excised on August 31.  Yesterday she began experiencing new pain in the right upper quadrant that has been progressively worsening.  Denies any jaundice, urinary changes, chest pain, shortness of breath, nausea, vomiting.  She does note that she was diagnosed with COVID-19 about 2 weeks ago but states that her symptoms have mostly resolved, though she is still having mild diarrhea.  Denies any discoloration of her stools.  No other complaints.  Physical Exam  BP (!) 149/111 (BP Location: Left Arm)   Pulse 84   Temp 97.9 F (36.6 C) (Oral)   SpO2 99%  Gen:   Awake, no distress   Resp:  Normal effort  MSK:   Moves extremities without difficulty  Other:    Medical Decision Making  Medically screening exam initiated at 6:06 PM.  Appropriate orders placed.  [name] was informed that the remainder of the evaluation will be completed by another provider, this initial triage assessment does not replace that evaluation, and the importance of remaining in the ED until their evaluation is complete.     Rayna Sexton, PA-C 05/02/21 1809    Dorie Rank, MD 05/03/21 [id number]

## 2021-05-02 NOTE — ED Triage Notes (Signed)
Per pt, states she has a tumor on her liver-she is suppose to have it remove at [facility] the end of August-states increased pain since last night-PCP wanted he to come to ED for eval

## 2021-05-02 NOTE — ED Provider Notes (Signed)
[place] DEPT Provider Note   CSN: [id number] Arrival date & time: 05/02/21  1715     History Chief Complaint  Patient presents with   Abdominal Pain    Madison Murillo is a 45 y.o. female with a hx of hypertension and cholecystectomy who presents to the ED with complaints of abdominal pain that began yesterday and acutely worsened today. 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 Pain is in the RUQ, radiates to the back, worse with any types of movement, no alleviating factors. Noted her lower extremities to be more swollen today bilaterally as well, has occurred previously.  Denies nausea, vomiting, diarrhea, dysuria, fever, chest pain, hemoptysis, or dyspnea.    HPI     Past Medical History:  Diagnosis Date   Preeclampsia    Vaginal delivery     There are no problems to display for this patient.   Past Surgical History:  Procedure Laterality Date   ANKLE SURGERY Left    BIOPSY  04/06/2021   Procedure: BIOPSY;  Surgeon: Ronnette Juniper, MD;  Location: [facility] ENDOSCOPY;  Service: Gastroenterology;;   CHOLECYSTECTOMY     ESOPHAGOGASTRODUODENOSCOPY (EGD) WITH PROPOFOL N/A 04/06/2021   Procedure: ESOPHAGOGASTRODUODENOSCOPY (EGD) WITH PROPOFOL;  Surgeon: Ronnette Juniper, MD;  Location: [facility] ENDOSCOPY;  Service: Gastroenterology;  Laterality: N/A;     OB History   No obstetric history on file.     History reviewed. No pertinent family history.  Social History   Tobacco Use   Smoking status: Never   Smokeless tobacco: Never  Vaping Use   Vaping Use: Never used  Substance Use Topics   Alcohol use: No   Drug use: No    Home Medications Prior to Admission medications   Medication Sig Start Date End Date Taking? Authorizing Provider  amLODipine (NORVASC) 5 MG tablet  Take 1 tablet (5 mg total) by mouth daily. Patient taking differently: Take 5 mg by mouth in the morning and at bedtime. 12/18/20 04/05/24  Jeralyn Bennett, MD  hydrochlorothiazide (MICROZIDE) 12.5 MG capsule Take 12.5 mg by mouth in the morning. 03/09/21   [provider]  lisinopril (ZESTRIL) 20 MG tablet Take 20 mg by mouth in the morning. 03/02/21   [provider]  losartan (COZAAR) 50 MG tablet Take 1 tablet (50 mg total) by mouth daily. Patient not taking: Reported on 04/05/2021 12/18/20 01/17/21  Jeralyn Bennett, MD    Allergies    Food and Codeine  Review of Systems   Review of Systems  Constitutional:  Negative for chills and fever.  Respiratory:  Negative for cough and shortness of breath.   Cardiovascular:  Positive for leg swelling (bilateral). Negative for chest pain.  Gastrointestinal:  Positive for abdominal pain. Negative for blood in stool, diarrhea, nausea and vomiting.  Genitourinary:  Negative for dysuria.  Neurological:  Negative for syncope.  All other systems reviewed and are negative.  Physical Exam Updated Vital Signs BP 135/82   Pulse 65   Temp 98.4 F (36.9 C) (Oral)   Resp 16   SpO2 99%   Physical Exam Vitals and nursing note reviewed.  Constitutional:      General: She is not in acute distress.    Appearance: She is well-developed. She is not toxic-appearing.  HENT:     Head: Normocephalic and atraumatic.  Eyes:     General:        Right eye: No discharge.        Left eye: No discharge.     Conjunctiva/sclera: Conjunctivae normal.  Cardiovascular:     Rate and Rhythm: Normal rate and regular rhythm.  Pulmonary:     Effort: Pulmonary effort is normal. No respiratory distress.     Breath sounds: Normal breath sounds. No wheezing, rhonchi or rales.  Abdominal:     Palpations: Abdomen is soft.     Tenderness: There is abdominal tenderness in the right upper quadrant, right lower quadrant, epigastric area and periumbilical area.   Musculoskeletal:     Cervical back: Neck supple.     Comments: Mild LE swelling at the ankles. No significant pitting edema. No calf tenderness.   Skin:    General: Skin is warm and dry.     Findings: No rash.  Neurological:     Mental Status: She is alert.     Comments: Clear speech.   Psychiatric:        Behavior: Behavior normal.    ED Results / Procedures / Treatments   Labs (all labs ordered are listed, but only abnormal results are displayed) Labs Reviewed  COMPREHENSIVE METABOLIC PANEL - Abnormal; Notable for the following components:      Result Value   Calcium 8.8 (*)    AST 8 (*)    All other components within normal limits  CBC - Abnormal; Notable for the following components:   Hemoglobin 10.5 (*)    HCT 34.4 (*)    MCV 79.1 (*)    MCH 24.1 (*)    RDW 17.1 (*)    All other components within normal limits  URINALYSIS, ROUTINE W REFLEX MICROSCOPIC - Abnormal; Notable for the following components:   APPearance HAZY (*)    Leukocytes,Ua MODERATE (*)    Bacteria, UA FEW (*)    All other components within normal limits  LIPASE, BLOOD  I-STAT BETA HCG BLOOD, ED ([facility], [facility], [facility] ONLY)    EKG None  Radiology CT ABDOMEN PELVIS W CONTRAST  Result Date: 05/03/2021 CLINICAL DATA:  Right upper quadrant abdominal pain, liver tumor scheduled for removal 06/21/2021, acutely worsening pain/symptoms EXAM: CT ABDOMEN AND PELVIS WITH CONTRAST TECHNIQUE: Multidetector CT imaging of the abdomen and pelvis was performed using the standard protocol following bolus administration of intravenous contrast. CONTRAST:  141mL OMNIPAQUE IOHEXOL 350 MG/ML SOLN COMPARISON:  CT 12/18/2020, MR 01/02/2021 FINDINGS: Lower chest: Atelectatic changes in the lung bases. Hepatobiliary: Redemonstration of the large cystic lesion occupying much of the right lobe liver measuring up to 16 x 14 x 21 cm in size which is fairly similar to comparison prior counting for differences in technique. Intermediate  attenuation of the majority of this cystic lesion with more low-attenuation gradient seen anteriorly as well as at least 2 small internal rounded hypoattenuating foci measuring up to 1.2 and 1.4 cm respectively (2/12) corresponding with areas of more intrinsic T2 hyper attenuation on comparison MRI. No other focal liver lesion. Prior cholecystectomy. No significant biliary ductal dilatation accounting for reservoir effect. No visible intraductal gallstones. Pancreas: No pancreatic ductal dilatation or surrounding inflammatory changes. Spleen: Normal in size. No concerning splenic lesions. Adrenals/Urinary Tract: Normal adrenal glands. Kidneys are normally located with symmetric enhancementand excretion. Few subcentimeter hypoattenuating foci in the kidneys, too small to fully characterize on CT imaging but statistically likely benign. No suspicious renal lesion, urolithiasis or hydronephrosis. Urinary bladder is largely  decompressed at the time of exam and therefore poorly evaluated by CT imaging. No gross bladder abnormality accounting for underdistention. Stomach/Bowel: Distal esophagus, stomach and duodenal sweep are unremarkable. No small bowel wall thickening or dilatation. No evidence of obstruction. A normal appendix is visualized. No colonic dilatation or wall thickening. Vascular/Lymphatic: Atherosclerotic calcifications within the abdominal aorta and branch vessels. No aneurysm or ectasia. No enlarged abdominopelvic lymph nodes. Reproductive: Anteverted uterus. No concerning uterine mass or adnexal lesions. Other: No abdominopelvic free fluid or free gas. No bowel containing hernias. Musculoskeletal: No acute osseous abnormality or suspicious osseous lesion. Transitional lumbosacral vertebrae noted with partial fusion to the left sacral ala. IMPRESSION: Redemonstration of the large cystic lesion occupying much of the right lobe liver measuring up to 21 cm in size, not significantly changed from comparison  prior. There is in intermediate attenuation to this lesion albeit with gradient density which could suggest some layering hemorrhagic or proteinaceous debris. More focal hypoattenuating rounded foci could reflect some loculated components corresponding to more T2 hyperintense foci on MR imaging. No significant surrounding inflammation or thick-walled rim enhancement to suggest developing abscess nor evidence to suggest active rupture at this time. Electronically Signed   By: Lovena Le M.D.   On: 05/03/2021 01:30    Procedures Procedures   Medications Ordered in ED Medications  sodium chloride (PF) 0.9 % injection (has no administration in time range)  morphine 4 MG/ML injection 4 mg (4 mg Intravenous Given 05/02/21 2324)  ondansetron (ZOFRAN) injection 4 mg (4 mg Intravenous Given 05/02/21 2323)  iohexol (OMNIPAQUE) 350 MG/ML injection 100 mL (100 mLs Intravenous Contrast Given 05/03/21 0018)  HYDROmorphone (DILAUDID) injection 1 mg (1 mg Intravenous Given 05/03/21 0217)    ED Course  I have reviewed the triage vital signs and the nursing notes.  Pertinent labs & imaging results that were available during my care of the patient were reviewed by me and considered in my medical decision making (see chart for details).    MDM Rules/Calculators/A&P                         Patient presents to the ED with complaints of abdominal pain. Patient nontoxic appearing, in no apparent distress, vitals w/ elevated BP at times- doubt HTN emergency. On exam patient tender to RUQ primarily with additional RLQ, epigastric & periumbilical tenderness.  Analgesics ordered - allergic to codeine but clarified patient has tolerated morphine well previously.   Additional history obtained:  Additional history obtained from chart review & nursing note review.   Lab Tests:  I reviewed and interpreted labs, which included:  CBC: Mild anemia similar to prior.  CMP: Fairly unremarkable.  Lipase: WNL UA: leuks  present, no urinary sxs to suggest UTI.  Preg test: Negative  Imaging Studies ordered:  CT A/P ordered in triage, I independently reviewed, formal radiology impression shows:  Redemonstration of the large cystic lesion occupying much of the right lobe liver measuring up to 21 cm in size, not significantly changed from comparison prior. There is in intermediate attenuation to this lesion albeit with gradient density which could suggest some layering hemorrhagic or proteinaceous debris. More focal hypoattenuating rounded foci could reflect some loculated components corresponding to more T2 hyperintense foci on MR imaging. No significant surrounding inflammation or thick-walled rim enhancement to suggest developing abscess nor evidence to suggest active rupture at this time.  ED Course:  Patient's labs are overall reassuring.  Her CT scan does not  appear significantly changed from prior, no evidence of rupture, patient is afebrile without leukocytosis to indicate infection, no active extravasation.  Patient feeling improved following analgesics in the emergency department, repeat abdominal exam without peritoneal signs, low suspicion for acute surgical process.  Will discharge home with continued pain control and instructions for very close follow-up with her surgeon. I discussed results, treatment plan, need for follow-up, and return precautions with the patient. Provided opportunity for questions, patient confirmed understanding and is in agreement with plan.   Findings and plan of care discussed with supervising physician Dr. Christy Gentles who has provided guidance & is in agreement.   [place] Controlled Substance reporting System queried   Portions of this note were generated with [employer]. Dictation errors may occur despite best attempts at proofreading.    Final Clinical Impression(s) / ED Diagnoses Final diagnoses:  Right upper quadrant abdominal pain  Liver cyst    Rx  / DC Orders ED Discharge Orders          Ordered    oxyCODONE-acetaminophen (PERCOCET/ROXICET) 5-325 MG tablet  Every 6 hours PRN        05/03/21 0334             Amaryllis Dyke, PA-C 05/03/21 3704    Ripley Fraise, MD 05/03/21 [id number]

## 2021-05-03 ENCOUNTER — Encounter (HOSPITAL_COMMUNITY): Payer: Self-pay

## 2021-05-03 ENCOUNTER — Emergency Department (HOSPITAL_COMMUNITY): Payer: BC Managed Care – PPO

## 2021-05-03 DIAGNOSIS — R109 Unspecified abdominal pain: Secondary | ICD-10-CM | POA: Diagnosis not present

## 2021-05-03 MED ORDER — OXYCODONE-ACETAMINOPHEN 5-325 MG PO TABS: 1.0000 | ORAL_TABLET | Freq: Four times a day (QID) | ORAL | 0 refills | Status: AC | PRN

## 2021-05-03 MED ORDER — HYDROMORPHONE HCL 1 MG/ML IJ SOLN
1.0000 mg | Freq: Once | INTRAMUSCULAR | Status: AC
  Administered 2021-05-03: 1 mg via INTRAVENOUS
  Filled 2021-05-03: qty 1

## 2021-05-03 MED ORDER — IOHEXOL 350 MG/ML SOLN
100.0000 mL | Freq: Once | INTRAVENOUS | Status: AC | PRN
  Administered 2021-05-03: 100 mL via INTRAVENOUS

## 2021-05-03 NOTE — Discharge Instructions (Addendum)
You were seen in the emergency department today for abdominal pain.  Your labs show that you are mildly anemic, this is similar to prior blood work we have on record.  Your cysts do not appear significantly changed on CT today compared to prior imaging.  We are sending you home with Percocet to take for pain.  -Percocet-this is a narcotic/controlled substance medication that has potential addicting qualities.  We recommend that you take 1-2 tablets every 6 hours as needed for severe pain.  Do not drive or operate heavy machinery when taking this medicine as it can be sedating. Do not drink alcohol or take other sedating medications when taking this medicine for safety reasons.  Keep this out of reach of small children.  Please be aware this medicine has Tylenol in it (325 mg/tab) do not exceed the maximum dose of Tylenol in a day per over the counter recommendations should you decide to supplement with Tylenol over the counter.   We have prescribed you new medication(s) today. Discuss the medications prescribed today with your pharmacist as they can have adverse effects and interactions with your other medicines including over the counter and prescribed medications. Seek medical evaluation if you start to experience new or abnormal symptoms after taking one of these medicines, seek care immediately if you start to experience difficulty breathing, feeling of your throat closing, facial swelling, or rash as these could be indications of a more serious allergic reaction  Please follow-up with your surgeon at [facility], call tomorrow morning to make them aware of your visit today and to establish close follow-up.  You may also follow-up with your primary care provider.  Return to the ER for new or worsening symptoms including but not limited to new or worsening pain, fever, inability to keep fluids down, problems urinating or having bowel movements, or any other concerns.

## 2021-05-03 NOTE — ED Notes (Signed)
Pt verbalized understanding of d/c, medication, and follow up care.

## 2021-05-10 DIAGNOSIS — D518 Other vitamin B12 deficiency anemias: Secondary | ICD-10-CM | POA: Diagnosis not present

## 2021-05-10 DIAGNOSIS — R1011 Right upper quadrant pain: Secondary | ICD-10-CM | POA: Diagnosis not present

## 2021-05-10 DIAGNOSIS — I10 Essential (primary) hypertension: Secondary | ICD-10-CM | POA: Diagnosis not present

## 2021-05-10 DIAGNOSIS — R160 Hepatomegaly, not elsewhere classified: Secondary | ICD-10-CM | POA: Diagnosis not present

## 2021-05-10 DIAGNOSIS — D509 Iron deficiency anemia, unspecified: Secondary | ICD-10-CM | POA: Diagnosis not present

## 2021-05-25 DIAGNOSIS — R9431 Abnormal electrocardiogram [ECG] [EKG]: Secondary | ICD-10-CM | POA: Diagnosis not present

## 2021-05-25 DIAGNOSIS — D649 Anemia, unspecified: Secondary | ICD-10-CM | POA: Diagnosis not present

## 2021-05-25 DIAGNOSIS — Z01810 Encounter for preprocedural cardiovascular examination: Secondary | ICD-10-CM | POA: Diagnosis not present

## 2021-05-25 DIAGNOSIS — I517 Cardiomegaly: Secondary | ICD-10-CM | POA: Diagnosis not present

## 2021-05-25 DIAGNOSIS — I10 Essential (primary) hypertension: Secondary | ICD-10-CM | POA: Diagnosis not present

## 2021-06-07 DIAGNOSIS — I10 Essential (primary) hypertension: Secondary | ICD-10-CM | POA: Diagnosis not present

## 2021-06-07 DIAGNOSIS — Z01818 Encounter for other preprocedural examination: Secondary | ICD-10-CM | POA: Diagnosis not present

## 2021-06-07 DIAGNOSIS — Z6841 Body Mass Index (BMI) 40.0 and over, adult: Secondary | ICD-10-CM | POA: Diagnosis not present

## 2021-06-07 DIAGNOSIS — D649 Anemia, unspecified: Secondary | ICD-10-CM | POA: Diagnosis not present

## 2021-06-07 DIAGNOSIS — K7689 Other specified diseases of liver: Secondary | ICD-10-CM | POA: Diagnosis not present

## 2021-06-07 DIAGNOSIS — K219 Gastro-esophageal reflux disease without esophagitis: Secondary | ICD-10-CM | POA: Diagnosis not present

## 2021-06-09 DIAGNOSIS — Z01810 Encounter for preprocedural cardiovascular examination: Secondary | ICD-10-CM | POA: Diagnosis not present

## 2021-06-09 DIAGNOSIS — R011 Cardiac murmur, unspecified: Secondary | ICD-10-CM | POA: Diagnosis not present

## 2021-06-13 DIAGNOSIS — D490 Neoplasm of unspecified behavior of digestive system: Secondary | ICD-10-CM | POA: Diagnosis not present

## 2021-06-13 DIAGNOSIS — I10 Essential (primary) hypertension: Secondary | ICD-10-CM | POA: Diagnosis not present

## 2021-06-13 DIAGNOSIS — R1011 Right upper quadrant pain: Secondary | ICD-10-CM | POA: Diagnosis not present

## 2021-06-13 DIAGNOSIS — R112 Nausea with vomiting, unspecified: Secondary | ICD-10-CM | POA: Diagnosis not present

## 2021-06-21 DIAGNOSIS — D134 Benign neoplasm of liver: Secondary | ICD-10-CM | POA: Diagnosis not present

## 2021-06-21 DIAGNOSIS — Z6841 Body Mass Index (BMI) 40.0 and over, adult: Secondary | ICD-10-CM | POA: Diagnosis not present

## 2021-06-21 DIAGNOSIS — D649 Anemia, unspecified: Secondary | ICD-10-CM | POA: Diagnosis not present

## 2021-06-21 DIAGNOSIS — Z79899 Other long term (current) drug therapy: Secondary | ICD-10-CM | POA: Diagnosis not present

## 2021-06-21 DIAGNOSIS — K7689 Other specified diseases of liver: Secondary | ICD-10-CM | POA: Diagnosis not present

## 2021-06-21 DIAGNOSIS — Z885 Allergy status to narcotic agent status: Secondary | ICD-10-CM | POA: Diagnosis not present

## 2021-06-21 DIAGNOSIS — I10 Essential (primary) hypertension: Secondary | ICD-10-CM | POA: Diagnosis not present

## 2021-06-22 DIAGNOSIS — Z885 Allergy status to narcotic agent status: Secondary | ICD-10-CM | POA: Diagnosis not present

## 2021-06-22 DIAGNOSIS — D649 Anemia, unspecified: Secondary | ICD-10-CM | POA: Diagnosis not present

## 2021-06-22 DIAGNOSIS — K7689 Other specified diseases of liver: Secondary | ICD-10-CM | POA: Diagnosis not present

## 2021-06-22 DIAGNOSIS — Z6841 Body Mass Index (BMI) 40.0 and over, adult: Secondary | ICD-10-CM | POA: Diagnosis not present

## 2021-06-22 DIAGNOSIS — Z79899 Other long term (current) drug therapy: Secondary | ICD-10-CM | POA: Diagnosis not present

## 2021-06-22 DIAGNOSIS — I10 Essential (primary) hypertension: Secondary | ICD-10-CM | POA: Diagnosis not present

## 2021-06-23 DIAGNOSIS — Z6841 Body Mass Index (BMI) 40.0 and over, adult: Secondary | ICD-10-CM | POA: Diagnosis not present

## 2021-06-23 DIAGNOSIS — I10 Essential (primary) hypertension: Secondary | ICD-10-CM | POA: Diagnosis not present

## 2021-06-23 DIAGNOSIS — K7689 Other specified diseases of liver: Secondary | ICD-10-CM | POA: Diagnosis not present

## 2021-06-23 DIAGNOSIS — Z09 Encounter for follow-up examination after completed treatment for conditions other than malignant neoplasm: Secondary | ICD-10-CM | POA: Diagnosis not present

## 2021-06-27 DIAGNOSIS — I10 Essential (primary) hypertension: Secondary | ICD-10-CM | POA: Diagnosis not present

## 2021-06-27 DIAGNOSIS — K7689 Other specified diseases of liver: Secondary | ICD-10-CM | POA: Diagnosis not present

## 2021-06-27 DIAGNOSIS — Z09 Encounter for follow-up examination after completed treatment for conditions other than malignant neoplasm: Secondary | ICD-10-CM | POA: Diagnosis not present

## 2021-06-27 DIAGNOSIS — Z6841 Body Mass Index (BMI) 40.0 and over, adult: Secondary | ICD-10-CM | POA: Diagnosis not present

## 2021-06-27 DIAGNOSIS — G8918 Other acute postprocedural pain: Secondary | ICD-10-CM | POA: Diagnosis not present

## 2021-07-05 DIAGNOSIS — I10 Essential (primary) hypertension: Secondary | ICD-10-CM | POA: Diagnosis not present

## 2021-07-05 DIAGNOSIS — R1111 Vomiting without nausea: Secondary | ICD-10-CM | POA: Diagnosis not present

## 2021-07-05 DIAGNOSIS — R42 Dizziness and giddiness: Secondary | ICD-10-CM | POA: Diagnosis not present

## 2021-07-05 DIAGNOSIS — D509 Iron deficiency anemia, unspecified: Secondary | ICD-10-CM | POA: Diagnosis not present

## 2021-07-05 DIAGNOSIS — Z48815 Encounter for surgical aftercare following surgery on the digestive system: Secondary | ICD-10-CM | POA: Diagnosis not present

## 2021-07-05 DIAGNOSIS — R160 Hepatomegaly, not elsewhere classified: Secondary | ICD-10-CM | POA: Diagnosis not present

## 2021-07-11 DIAGNOSIS — K7689 Other specified diseases of liver: Secondary | ICD-10-CM | POA: Diagnosis not present

## 2021-07-11 DIAGNOSIS — Z9049 Acquired absence of other specified parts of digestive tract: Secondary | ICD-10-CM | POA: Diagnosis not present

## 2021-07-11 DIAGNOSIS — K219 Gastro-esophageal reflux disease without esophagitis: Secondary | ICD-10-CM | POA: Diagnosis not present

## 2021-07-11 DIAGNOSIS — R1011 Right upper quadrant pain: Secondary | ICD-10-CM | POA: Diagnosis not present

## 2021-07-11 DIAGNOSIS — R600 Localized edema: Secondary | ICD-10-CM | POA: Diagnosis not present

## 2021-07-11 DIAGNOSIS — Z6841 Body Mass Index (BMI) 40.0 and over, adult: Secondary | ICD-10-CM | POA: Diagnosis not present

## 2021-07-11 DIAGNOSIS — Z8739 Personal history of other diseases of the musculoskeletal system and connective tissue: Secondary | ICD-10-CM | POA: Diagnosis not present

## 2021-07-11 DIAGNOSIS — R110 Nausea: Secondary | ICD-10-CM | POA: Diagnosis not present

## 2021-07-11 DIAGNOSIS — Z09 Encounter for follow-up examination after completed treatment for conditions other than malignant neoplasm: Secondary | ICD-10-CM | POA: Diagnosis not present

## 2021-07-11 DIAGNOSIS — N926 Irregular menstruation, unspecified: Secondary | ICD-10-CM | POA: Diagnosis not present

## 2021-07-11 DIAGNOSIS — I10 Essential (primary) hypertension: Secondary | ICD-10-CM | POA: Diagnosis not present

## 2021-07-11 DIAGNOSIS — Z8719 Personal history of other diseases of the digestive system: Secondary | ICD-10-CM | POA: Diagnosis not present

## 2021-07-11 DIAGNOSIS — Z8742 Personal history of other diseases of the female genital tract: Secondary | ICD-10-CM | POA: Diagnosis not present

## 2021-07-19 ENCOUNTER — Emergency Department (HOSPITAL_COMMUNITY): Payer: BC Managed Care – PPO

## 2021-07-19 ENCOUNTER — Encounter (HOSPITAL_COMMUNITY): Payer: Self-pay

## 2021-07-19 ENCOUNTER — Emergency Department (HOSPITAL_COMMUNITY)
Admission: EM | Admit: 2021-07-19 | Discharge: 2021-07-19 | Disposition: A | Discharge status: Home / Self Care | Payer: BC Managed Care – PPO | Attending: Emergency Medicine | Admitting: Emergency Medicine

## 2021-07-19 DIAGNOSIS — R1084 Generalized abdominal pain: Secondary | ICD-10-CM | POA: Diagnosis not present

## 2021-07-19 DIAGNOSIS — R112 Nausea with vomiting, unspecified: Secondary | ICD-10-CM | POA: Insufficient documentation

## 2021-07-19 DIAGNOSIS — K7689 Other specified diseases of liver: Secondary | ICD-10-CM | POA: Diagnosis not present

## 2021-07-19 DIAGNOSIS — R42 Dizziness and giddiness: Secondary | ICD-10-CM | POA: Insufficient documentation

## 2021-07-19 DIAGNOSIS — R590 Localized enlarged lymph nodes: Secondary | ICD-10-CM | POA: Diagnosis not present

## 2021-07-19 DIAGNOSIS — R1011 Right upper quadrant pain: Secondary | ICD-10-CM | POA: Insufficient documentation

## 2021-07-19 DIAGNOSIS — I10 Essential (primary) hypertension: Secondary | ICD-10-CM | POA: Diagnosis not present

## 2021-07-19 DIAGNOSIS — R58 Hemorrhage, not elsewhere classified: Secondary | ICD-10-CM | POA: Diagnosis not present

## 2021-07-19 DIAGNOSIS — N9489 Other specified conditions associated with female genital organs and menstrual cycle: Secondary | ICD-10-CM | POA: Insufficient documentation

## 2021-07-19 DIAGNOSIS — R188 Other ascites: Secondary | ICD-10-CM | POA: Diagnosis not present

## 2021-07-19 LAB — URINALYSIS, ROUTINE W REFLEX MICROSCOPIC
Bilirubin Urine: NEGATIVE
Glucose, UA: NEGATIVE mg/dL
Ketones, ur: NEGATIVE mg/dL
Nitrite: NEGATIVE
Protein, ur: NEGATIVE mg/dL
Specific Gravity, Urine: 1.005 (ref 1.005–1.030)
pH: 7 (ref 5.0–8.0)

## 2021-07-19 LAB — COMPREHENSIVE METABOLIC PANEL
ALT: 12 U/L (ref 0–44)
AST: 12 U/L — ABNORMAL LOW (ref 15–41)
Albumin: 4 g/dL (ref 3.5–5.0)
Alkaline Phosphatase: 95 U/L (ref 38–126)
Anion gap: 10 (ref 5–15)
BUN: 8 mg/dL (ref 6–20)
CO2: 25 mmol/L (ref 22–32)
Calcium: 9 mg/dL (ref 8.9–10.3)
Chloride: 101 mmol/L (ref 98–111)
Creatinine, Ser: 0.79 mg/dL (ref 0.44–1.00)
GFR, Estimated: >60 mL/min (ref >60)
Glucose, Bld: 156 mg/dL — ABNORMAL HIGH (ref 70–99)
Potassium: 3.3 mmol/L — ABNORMAL LOW (ref 3.5–5.1)
Sodium: 136 mmol/L (ref 135–145)
Total Bilirubin: 0.6 mg/dL (ref 0.3–1.2)
Total Protein: 7.6 g/dL (ref 6.5–8.1)

## 2021-07-19 LAB — CBC WITH DIFFERENTIAL/PLATELET
Abs Immature Granulocytes: 0.06 10*3/uL (ref 0.00–0.07)
Basophils Absolute: 0 10*3/uL (ref 0.0–0.1)
Basophils Relative: 0 %
Eosinophils Absolute: 0 10*3/uL (ref 0.0–0.5)
Eosinophils Relative: 1 %
HCT: 34.4 % — ABNORMAL LOW (ref 36.0–46.0)
Hemoglobin: 10.5 g/dL — ABNORMAL LOW (ref 12.0–15.0)
Immature Granulocytes: 1 %
Lymphocytes Relative: 11 %
Lymphs Abs: 0.9 10*3/uL (ref 0.7–4.0)
MCH: 24.2 pg — ABNORMAL LOW (ref 26.0–34.0)
MCHC: 30.5 g/dL (ref 30.0–36.0)
MCV: 79.3 fL — ABNORMAL LOW (ref 80.0–100.0)
Monocytes Absolute: 0.3 10*3/uL (ref 0.1–1.0)
Monocytes Relative: 4 %
Neutro Abs: 7 10*3/uL (ref 1.7–7.7)
Neutrophils Relative %: 83 %
Platelets: 346 10*3/uL (ref 150–400)
RBC: 4.34 MIL/uL (ref 3.87–5.11)
RDW: 15.4 % (ref 11.5–15.5)
WBC: 8.4 10*3/uL (ref 4.0–10.5)
nRBC: 0 % (ref 0.0–0.2)

## 2021-07-19 LAB — I-STAT BETA HCG BLOOD, ED (MC, WL, AP ONLY): I-stat hCG, quantitative: 740.7 m[IU]/mL — ABNORMAL HIGH (ref <5)

## 2021-07-19 LAB — HCG, QUANTITATIVE, PREGNANCY: hCG, Beta Chain, Quant, S: 1 m[IU]/mL (ref <5)

## 2021-07-19 LAB — LIPASE, BLOOD: Lipase: 31 U/L (ref 11–51)

## 2021-07-19 MED ORDER — MECLIZINE HCL 25 MG PO TABS: 25.0000 mg | ORAL_TABLET | Freq: Three times a day (TID) | ORAL | 0 refills | Status: AC | PRN

## 2021-07-19 MED ORDER — MECLIZINE HCL 25 MG PO TABS
25.0000 mg | ORAL_TABLET | Freq: Once | ORAL | Status: AC
  Administered 2021-07-19: 25 mg via ORAL
  Filled 2021-07-19: qty 1

## 2021-07-19 MED ORDER — SODIUM CHLORIDE 0.9 % IV BOLUS
1000.0000 mL | Freq: Once | INTRAVENOUS | Status: AC
Start: 2021-07-19 — End: 2021-07-19
  Administered 2021-07-19: 1000 mL via INTRAVENOUS

## 2021-07-19 MED ORDER — METOCLOPRAMIDE HCL 5 MG/ML IJ SOLN
10.0000 mg | Freq: Once | INTRAMUSCULAR | Status: AC
  Administered 2021-07-19: 10 mg via INTRAVENOUS
  Filled 2021-07-19: qty 2

## 2021-07-19 MED ORDER — ONDANSETRON HCL 4 MG/2ML IJ SOLN
4.0000 mg | Freq: Once | INTRAMUSCULAR | Status: AC
  Administered 2021-07-19: 4 mg via INTRAVENOUS
  Filled 2021-07-19: qty 2

## 2021-07-19 MED ORDER — DIPHENHYDRAMINE HCL 25 MG PO CAPS
25.0000 mg | ORAL_CAPSULE | Freq: Once | ORAL | Status: AC
  Administered 2021-07-19: 25 mg via ORAL
  Filled 2021-07-19: qty 1

## 2021-07-19 MED ORDER — SODIUM CHLORIDE 0.9 % IV SOLN
12.5000 mg | Freq: Once | INTRAVENOUS | Status: AC
  Administered 2021-07-19: 12.5 mg via INTRAVENOUS
  Filled 2021-07-19: qty 12.5
  Filled 2021-07-19: qty 0.5

## 2021-07-19 MED ORDER — IOHEXOL 350 MG/ML SOLN
80.0000 mL | Freq: Once | INTRAVENOUS | Status: AC | PRN
  Administered 2021-07-19: 80 mL via INTRAVENOUS

## 2021-07-19 NOTE — Discharge Instructions (Addendum)
You came to the emergency department today to be evaluated for dizziness, nausea, vomiting, and abdominal pain.  The CT scan of your abdomen and pelvis showed no acute changes.  Your lab work was reassuring.  You will need to follow-up with your surgeon Dr. Colonel Bald.  Please take your prescribed Zofran as needed.  The CT scan of your head and MRI of your brain showed no acute abnormalities.  I have given you information to follow-up with neurology.  Please call to schedule a follow-up appointment due to your dizziness.  I have also given you prescription for meclizine.  Please take this medication as prescribed.  Get help right away if: You vomit or have diarrhea and are unable to eat or drink anything. You have problems talking, walking, swallowing, or using your arms, hands, or legs. You feel generally weak. You have any bleeding. You are not thinking clearly or you have trouble forming sentences. It may take a friend or family member to notice this. You have chest pain, abdominal pain, shortness of breath, or sweating. Your vision changes or you develop a severe headache.

## 2021-07-19 NOTE — ED Notes (Signed)
Istat beta 740.7

## 2021-07-19 NOTE — ED Provider Notes (Addendum)
[facility] DEPT Provider Note   CSN: [id number] Arrival date & time: 07/19/21  0093     History Chief Complaint  Patient presents with   Vomiting    Madison Murillo is a 45 y.o. female with medical history significant for complicated hepatic cyst removal, followed by [facility] transplant team, irregular menstrual cycles presents for evaluation of dizziness and vomiting, beginning at around 11-12 midnight.  Patient states got up and felt "woozy in the head" as if the room was spinning.  Since then has had multiple episodes of bilious emesis as well as diffuse abdominal pain worse to right upper abdomen.  States she was recently told that her tumor markers are very elevated.  She supposed be following up with OB/GYN for irregular menstrual bleeding, intermittent vaginal bleeding over the last month.  Apparently needed drainage tube after prior liver surgery as patient states there were complications.  Describes her pain as a burning sensation to her right upper abdomen.  Does not feel like the back.  She denies any headache, neck pain, paresthesias, weakness, neck pain, chest pain, shortness of breath.  Denies additional aggravating alleviating factors.  States she has had sensations of the room spinning previously however not this bad.  Denies additional aggravating or alleviating factors.  According to patient her [facility] team would like a call once her labs and imaging returns  History obtained from patient and past medical records.  No interpreter used.  HPI     Past Medical History:  Diagnosis Date   Preeclampsia    Vaginal delivery     There are no problems to display for this patient.   Past Surgical History:  Procedure Laterality Date   ANKLE SURGERY Left    BIOPSY  04/06/2021   Procedure: BIOPSY;  Surgeon: Ronnette Juniper, MD;  Location: [facility];  Service: Gastroenterology;;   CHOLECYSTECTOMY     ESOPHAGOGASTRODUODENOSCOPY (EGD) WITH PROPOFOL N/A  04/06/2021   Procedure: ESOPHAGOGASTRODUODENOSCOPY (EGD) WITH PROPOFOL;  Surgeon: Ronnette Juniper, MD;  Location: [facility];  Service: Gastroenterology;  Laterality: N/A;     OB History   No obstetric history on file.     History reviewed. No pertinent family history.  Social History   Tobacco Use   Smoking status: Never   Smokeless tobacco: Never  Vaping Use   Vaping Use: Never used  Substance Use Topics   Alcohol use: No   Drug use: No    Home Medications Prior to Admission medications   Medication Sig Start Date End Date Taking? Authorizing Provider  amLODipine (NORVASC) 5 MG tablet Take 1 tablet (5 mg total) by mouth daily. Patient not taking: No sig reported 12/18/20 04/05/24  Jeralyn Bennett, MD  gabapentin (NEURONTIN) 100 MG capsule Take 100 mg by mouth daily as needed. 06/22/21   [provider]  hydrochlorothiazide (MICROZIDE) 12.5 MG capsule Take 12.5 mg by mouth in the morning. Patient not taking: Reported on 07/19/2021 03/09/21   [provider]  lisinopril (ZESTRIL) 20 MG tablet Take 20 mg by mouth in the morning. Patient not taking: Reported on 07/19/2021 03/02/21   [provider]  losartan (COZAAR) 50 MG tablet Take 1 tablet (50 mg total) by mouth daily. Patient not taking: Reported on 04/05/2021 12/18/20 01/17/21  Jeralyn Bennett, MD  oxyCODONE-acetaminophen (PERCOCET/ROXICET) 5-325 MG tablet Take 1-2 tablets by mouth every 6 (six) hours as needed for severe pain. Patient not taking: Reported on 07/19/2021 05/03/21   Petrucelli, Aldona Bar R, PA-C  traMADol (  ULTRAM) 50 MG tablet Take 50 mg by mouth every 6 (six) hours as needed. 06/22/21   [provider]    Allergies    Food, Codeine, and Gluten meal  Review of Systems   Review of Systems  Constitutional:  Positive for chills and fatigue. Negative for activity change, appetite change, diaphoresis, fever and unexpected weight change.  HENT: Negative.    Respiratory: Negative.     Cardiovascular: Negative.   Gastrointestinal:  Positive for abdominal pain, nausea and vomiting. Negative for abdominal distention, anal bleeding, blood in stool, constipation, diarrhea and rectal pain.  Genitourinary:  Positive for menstrual problem and vaginal bleeding. Negative for decreased urine volume, difficulty urinating, dysuria, enuresis, flank pain, frequency, genital sores, pelvic pain, urgency, vaginal discharge and vaginal pain.  Musculoskeletal: Negative.   Skin: Negative.   Neurological:  Positive for dizziness and light-headedness. Negative for tremors, seizures, syncope, facial asymmetry, speech difficulty, weakness, numbness and headaches.  All other systems reviewed and are negative.  Physical Exam Updated Vital Signs BP (!) 152/106   Pulse 65   Temp 97.8 F (36.6 C) (Oral)   Resp 18   Ht 5\' 5"  (1.651 m)   Wt 132 kg   SpO2 95%   BMI 48.42 kg/m   Physical Exam Vitals and nursing note reviewed.  Constitutional:      General: She is not in acute distress.    Appearance: She is well-developed. She is obese. She is not ill-appearing, toxic-appearing or diaphoretic.  HENT:     Head: Normocephalic and atraumatic.     Nose: Nose normal.     Mouth/Throat:     Mouth: Mucous membranes are moist.  Eyes:     Pupils: Pupils are equal, round, and reactive to light.  Cardiovascular:     Rate and Rhythm: Normal rate.     Pulses: Normal pulses.     Heart sounds: Normal heart sounds.  Pulmonary:     Effort: Pulmonary effort is normal. No respiratory distress.     Breath sounds: Normal breath sounds.  Abdominal:     General: Bowel sounds are normal. There is no distension.     Palpations: Abdomen is soft.     Tenderness: There is abdominal tenderness. There is guarding. There is no right CVA tenderness or rebound.     Comments: Healing laparoscopic surgical incisions without drainage.  Diffuse tenderness to abdomen, worse to right upper quadrant  Musculoskeletal:         General: No swelling or tenderness. Normal range of motion.     Cervical back: Normal range of motion.     Right lower leg: No edema.     Left lower leg: No edema.     Comments: No bony tenderness.  Moves all 4 extremities without difficulty  Skin:    General: Skin is warm and dry.     Capillary Refill: Capillary refill takes less than 2 seconds.  Neurological:     General: No focal deficit present.     Mental Status: She is alert and oriented to person, place, and time.     Cranial Nerves: Cranial nerves are intact.     Sensory: Sensation is intact.     Motor: Motor function is intact.     Coordination: Coordination is intact.     Comments: CN 2-12 grossly intact Equal grip Intact sensation No nystagmus  Psychiatric:        Mood and Affect: Mood normal.    ED Results / Procedures /  Treatments   Labs (all labs ordered are listed, but only abnormal results are displayed) Labs Reviewed  CBC WITH DIFFERENTIAL/PLATELET - Abnormal; Notable for the following components:      Result Value   Hemoglobin 10.5 (*)    HCT 34.4 (*)    MCV 79.3 (*)    MCH 24.2 (*)    All other components within normal limits  COMPREHENSIVE METABOLIC PANEL - Abnormal; Notable for the following components:   Potassium 3.3 (*)    Glucose, Bld 156 (*)    AST 12 (*)    All other components within normal limits  LIPASE, BLOOD  URINALYSIS, ROUTINE W REFLEX MICROSCOPIC  I-STAT BETA HCG BLOOD, ED ([facility], [facility], [facility] ONLY)    EKG None  Radiology No results found.  Procedures Procedures   Medications Ordered in ED Medications  sodium chloride 0.9 % bolus 1,000 mL (1,000 mLs Intravenous New Bag/Given 07/19/21 0514)  ondansetron (ZOFRAN) injection 4 mg (4 mg Intravenous Given 07/19/21 9390)   ED Course  I have reviewed the triage vital signs and the nursing notes.  Pertinent labs & imaging results that were available during my care of the patient were reviewed by me and considered in my medical decision  making (see chart for details).  Patient here for evaluation of dizziness, emesis and abdominal pain.  She has complicated medical history, followed by [facility] transplant team.  Recent abdominal surgery 1 month ago for complicated hepatic cyst, requiring biliary drain.  She is afebrile, nonseptic appearing.  Symptoms began approximate 11 PM.  Does admit to some dizziness which she describes as the room is spinning.  She has a nonfocal neuro exam without deficits.  Her heart and lungs are clear.  Her abdomen is diffusely tender however worse to right upper abdomen.  Abdominal incisions do not appear actively infected.  Her compartments are soft, no bony tenderness.  Plan on labs, imaging and reassess however at this time I have low suspicion for intracranial abnormality such as dissection, SAH, mass, bleed, CVA as cause of her dizziness  Labs personally reviewed and interpreted:  Lipase 31 CBC without leukocytosis, Hgb 10.5 similar to prior CMP with hypokalemia at 3.3, glucose 156  Care transferred to oncoming team who will FU on remaining labs, imaging and dispo. Suspect will touch base with [facility] team once labs and imaging resulted.    MDM Rules/Calculators/A&P                            Final Clinical Impression(s) / ED Diagnoses Final diagnoses:  Non-intractable vomiting with nausea, unspecified vomiting type  Dizziness  Right upper quadrant abdominal pain    Rx / DC Orders ED Discharge Orders     None          Bernd Crom A, PA-C 07/19/21 0637    Molpus, Jenny Reichmann, MD 07/19/21 [phone]

## 2021-07-19 NOTE — ED Triage Notes (Signed)
Pt arrived via EMS, from home, c/o n/v x3 hours. Denies any other sx.

## 2021-07-19 NOTE — ED Provider Notes (Signed)
Care of patient assumed from Faribault Henderly at [phone].  Agree with history, physical exam and plan.  See their note for further details. Briefly, this is a 45 year old female with medical history for complicated cyst removal, followed by [facility] transplant team.  Presents with chief complaint of dizziness and vomiting.  Pain described as burning sensation to her right upper abdomen.   Physical Exam  BP (!) 152/106   Pulse 65   Temp 97.8 F (36.6 C) (Oral)   Resp 18   Ht 5\' 5"  (1.651 m)   Wt 132 kg   SpO2 95%   BMI 48.42 kg/m   Physical Exam Vitals and nursing note reviewed.  Constitutional:      General: She is not in acute distress.    Appearance: She is not ill-appearing, toxic-appearing or diaphoretic.  Eyes:     General: No scleral icterus.       Right eye: No discharge.        Left eye: No discharge.     Extraocular Movements: Extraocular movements intact.     Pupils: Pupils are equal, round, and reactive to light.  Cardiovascular:     Rate and Rhythm: Normal rate.  Pulmonary:     Effort: Pulmonary effort is normal.  Abdominal:     General: Bowel sounds are normal. There is no distension. There are no signs of injury.     Palpations: Abdomen is soft. There is no mass or pulsatile mass.     Tenderness: There is generalized abdominal tenderness. There is no guarding or rebound.  Musculoskeletal:     Cervical back: Normal range of motion and neck supple. No rigidity.  Skin:    General: Skin is warm and dry.  Neurological:     General: No focal deficit present.     Mental Status: She is alert and oriented to person, place, and time.     GCS: GCS eye subscore is 4. GCS verbal subscore is 5. GCS motor subscore is 6.     Cranial Nerves: No cranial nerve deficit or facial asymmetry.     Sensory: Sensation is intact.     Motor: Pronator drift present. No weakness, tremor or seizure activity.     Coordination: Finger-Nose-Finger Test normal.     Comments: CN II-XII intact;  performed in supine position, +5 strength to bilateral upper extremities, +5 strength to dorsiflexion and plantarflexion, sensation to light touch intact to bilateral upper and lower extremities.  Psychiatric:        Behavior: Behavior is cooperative.    ED Course/Procedures   Clinical Course as of 07/19/21 1823  Wed Jul 19, 2021  0854 Spoke to [place] with [facility] transplant team.  He advises that if patient is to tolerate p.o. intake she may be discharged to follow-up in outpatient setting. [PB]    Clinical Course User Index [PB] Loni Beckwith, PA-C    Procedures  MDM   At time of handoff CBC, CMP, and lipase reassuring.  Urinalysis, beta-hCG, and CT head and CT abdomen/pelvis pending.  Plan to contact [facility] transplant team pending CT abdomen/pelvis.  Urinalysis shows no signs of dehydration or infection hCG negative for pregnancy CT head shows no acute intracranial abnormality CT abdomen pelvis shows small amount of perihepatic fluid adjacent to liver lesion.  No other acute findings noted.  Spoke to patient's surgeon with [facility] transplant team Dr. Colonel Bald.    Patient reports continued nausea and vomiting.  We will give patient dose of Reglan and.  Patient complains of continued dizziness will meclizine.  On reassessment patient continues to have complaints of dizziness.  Patient unsteady on her feet.  Will obtain MRI to evaluate for possible acute CVA.  Will obtain EKG and if QTC is not prolongated we will give patient dose of promethazine.  MRI shows no acute infarct.  Patient able to tolerate p.o. intake.  Will prescribe patient with short course of meclizine.  Patient to follow-up with neurology in outpatient setting.  Patient to follow-up with [facility] transplant team.  Patient was offered promethazine suppository but defers at this time.  Discussed results, findings, treatment and follow up. Patient advised of return precautions. Patient verbalized understanding and  agreed with plan.  Patient care were discussed with attending physician Dr. Eulis Foster.      Loni Beckwith, PA-C 07/19/21 [place]    Daleen Bo, MD 07/19/21 [phone]

## 2021-07-20 DIAGNOSIS — R42 Dizziness and giddiness: Secondary | ICD-10-CM | POA: Diagnosis not present

## 2021-07-20 DIAGNOSIS — Z5329 Procedure and treatment not carried out because of patient's decision for other reasons: Secondary | ICD-10-CM | POA: Diagnosis not present

## 2021-07-25 DIAGNOSIS — Z20828 Contact with and (suspected) exposure to other viral communicable diseases: Secondary | ICD-10-CM | POA: Diagnosis not present

## 2021-07-25 DIAGNOSIS — D509 Iron deficiency anemia, unspecified: Secondary | ICD-10-CM | POA: Diagnosis not present

## 2021-07-25 DIAGNOSIS — E2839 Other primary ovarian failure: Secondary | ICD-10-CM | POA: Diagnosis not present

## 2021-07-25 DIAGNOSIS — Z3202 Encounter for pregnancy test, result negative: Secondary | ICD-10-CM | POA: Diagnosis not present

## 2021-07-25 DIAGNOSIS — E280 Estrogen excess: Secondary | ICD-10-CM | POA: Diagnosis not present

## 2021-07-25 DIAGNOSIS — E785 Hyperlipidemia, unspecified: Secondary | ICD-10-CM | POA: Diagnosis not present

## 2021-07-25 DIAGNOSIS — I10 Essential (primary) hypertension: Secondary | ICD-10-CM | POA: Diagnosis not present

## 2021-07-25 DIAGNOSIS — R112 Nausea with vomiting, unspecified: Secondary | ICD-10-CM | POA: Diagnosis not present

## 2021-07-25 DIAGNOSIS — R42 Dizziness and giddiness: Secondary | ICD-10-CM | POA: Diagnosis not present

## 2021-08-11 DIAGNOSIS — N926 Irregular menstruation, unspecified: Secondary | ICD-10-CM | POA: Diagnosis not present

## 2021-08-11 DIAGNOSIS — Z6841 Body Mass Index (BMI) 40.0 and over, adult: Secondary | ICD-10-CM | POA: Diagnosis not present

## 2021-08-11 DIAGNOSIS — Z09 Encounter for follow-up examination after completed treatment for conditions other than malignant neoplasm: Secondary | ICD-10-CM | POA: Diagnosis not present

## 2021-08-11 DIAGNOSIS — K7689 Other specified diseases of liver: Secondary | ICD-10-CM | POA: Diagnosis not present

## 2021-08-11 DIAGNOSIS — I10 Essential (primary) hypertension: Secondary | ICD-10-CM | POA: Diagnosis not present

## 2021-08-14 DIAGNOSIS — M9901 Segmental and somatic dysfunction of cervical region: Secondary | ICD-10-CM | POA: Diagnosis not present

## 2021-08-14 DIAGNOSIS — M62838 Other muscle spasm: Secondary | ICD-10-CM | POA: Diagnosis not present

## 2021-08-14 DIAGNOSIS — M542 Cervicalgia: Secondary | ICD-10-CM | POA: Diagnosis not present

## 2021-08-14 DIAGNOSIS — M9902 Segmental and somatic dysfunction of thoracic region: Secondary | ICD-10-CM | POA: Diagnosis not present

## 2021-08-15 DIAGNOSIS — I10 Essential (primary) hypertension: Secondary | ICD-10-CM | POA: Diagnosis not present

## 2021-08-15 DIAGNOSIS — Z01419 Encounter for gynecological examination (general) (routine) without abnormal findings: Secondary | ICD-10-CM | POA: Diagnosis not present

## 2021-08-15 DIAGNOSIS — N939 Abnormal uterine and vaginal bleeding, unspecified: Secondary | ICD-10-CM | POA: Diagnosis not present

## 2021-08-15 DIAGNOSIS — K219 Gastro-esophageal reflux disease without esophagitis: Secondary | ICD-10-CM | POA: Diagnosis not present

## 2021-08-15 DIAGNOSIS — G43909 Migraine, unspecified, not intractable, without status migrainosus: Secondary | ICD-10-CM | POA: Diagnosis not present

## 2021-08-15 DIAGNOSIS — Z6841 Body Mass Index (BMI) 40.0 and over, adult: Secondary | ICD-10-CM | POA: Diagnosis not present

## 2021-08-15 DIAGNOSIS — Z1151 Encounter for screening for human papillomavirus (HPV): Secondary | ICD-10-CM | POA: Diagnosis not present

## 2021-08-15 DIAGNOSIS — R87610 Atypical squamous cells of undetermined significance on cytologic smear of cervix (ASC-US): Secondary | ICD-10-CM | POA: Diagnosis not present

## 2021-08-15 DIAGNOSIS — Z9049 Acquired absence of other specified parts of digestive tract: Secondary | ICD-10-CM | POA: Diagnosis not present

## 2021-08-15 DIAGNOSIS — Z124 Encounter for screening for malignant neoplasm of cervix: Secondary | ICD-10-CM | POA: Diagnosis not present

## 2021-08-16 DIAGNOSIS — M62838 Other muscle spasm: Secondary | ICD-10-CM | POA: Diagnosis not present

## 2021-08-16 DIAGNOSIS — M9902 Segmental and somatic dysfunction of thoracic region: Secondary | ICD-10-CM | POA: Diagnosis not present

## 2021-08-16 DIAGNOSIS — M542 Cervicalgia: Secondary | ICD-10-CM | POA: Diagnosis not present

## 2021-08-16 DIAGNOSIS — M9901 Segmental and somatic dysfunction of cervical region: Secondary | ICD-10-CM | POA: Diagnosis not present

## 2021-08-23 DIAGNOSIS — N939 Abnormal uterine and vaginal bleeding, unspecified: Secondary | ICD-10-CM | POA: Diagnosis not present

## 2021-08-28 DIAGNOSIS — Z30430 Encounter for insertion of intrauterine contraceptive device: Secondary | ICD-10-CM | POA: Diagnosis not present

## 2021-08-28 DIAGNOSIS — N939 Abnormal uterine and vaginal bleeding, unspecified: Secondary | ICD-10-CM | POA: Diagnosis not present

## 2021-08-31 DIAGNOSIS — Z01818 Encounter for other preprocedural examination: Secondary | ICD-10-CM | POA: Diagnosis not present

## 2021-08-31 DIAGNOSIS — Z6841 Body Mass Index (BMI) 40.0 and over, adult: Secondary | ICD-10-CM | POA: Diagnosis not present

## 2021-08-31 DIAGNOSIS — K7689 Other specified diseases of liver: Secondary | ICD-10-CM | POA: Diagnosis not present

## 2021-09-01 DIAGNOSIS — H903 Sensorineural hearing loss, bilateral: Secondary | ICD-10-CM | POA: Diagnosis not present

## 2021-09-01 DIAGNOSIS — G43809 Other migraine, not intractable, without status migrainosus: Secondary | ICD-10-CM | POA: Diagnosis not present

## 2021-09-01 DIAGNOSIS — H8122 Vestibular neuronitis, left ear: Secondary | ICD-10-CM | POA: Diagnosis not present

## 2021-09-05 DIAGNOSIS — D376 Neoplasm of uncertain behavior of liver, gallbladder and bile ducts: Secondary | ICD-10-CM | POA: Diagnosis not present

## 2021-09-05 DIAGNOSIS — G8918 Other acute postprocedural pain: Secondary | ICD-10-CM | POA: Diagnosis not present

## 2021-09-05 DIAGNOSIS — Z9049 Acquired absence of other specified parts of digestive tract: Secondary | ICD-10-CM | POA: Diagnosis not present

## 2021-09-05 DIAGNOSIS — I10 Essential (primary) hypertension: Secondary | ICD-10-CM | POA: Diagnosis not present

## 2021-09-05 DIAGNOSIS — D62 Acute posthemorrhagic anemia: Secondary | ICD-10-CM | POA: Diagnosis not present

## 2021-09-05 DIAGNOSIS — D490 Neoplasm of unspecified behavior of digestive system: Secondary | ICD-10-CM | POA: Diagnosis not present

## 2021-09-05 DIAGNOSIS — G43809 Other migraine, not intractable, without status migrainosus: Secondary | ICD-10-CM | POA: Diagnosis not present

## 2021-09-05 DIAGNOSIS — K5909 Other constipation: Secondary | ICD-10-CM | POA: Diagnosis not present

## 2021-09-05 DIAGNOSIS — Z6841 Body Mass Index (BMI) 40.0 and over, adult: Secondary | ICD-10-CM | POA: Diagnosis not present

## 2021-09-05 DIAGNOSIS — R571 Hypovolemic shock: Secondary | ICD-10-CM | POA: Diagnosis not present

## 2021-09-05 DIAGNOSIS — Q446 Cystic disease of liver: Secondary | ICD-10-CM | POA: Diagnosis not present

## 2021-09-05 DIAGNOSIS — E861 Hypovolemia: Secondary | ICD-10-CM | POA: Diagnosis not present

## 2021-09-05 DIAGNOSIS — E872 Acidosis, unspecified: Secondary | ICD-10-CM | POA: Diagnosis not present

## 2021-09-05 DIAGNOSIS — Z5331 Laparoscopic surgical procedure converted to open procedure: Secondary | ICD-10-CM | POA: Diagnosis not present

## 2021-09-05 DIAGNOSIS — K7689 Other specified diseases of liver: Secondary | ICD-10-CM | POA: Diagnosis not present

## 2021-09-05 DIAGNOSIS — R578 Other shock: Secondary | ICD-10-CM | POA: Diagnosis not present

## 2021-09-05 DIAGNOSIS — Z9889 Other specified postprocedural states: Secondary | ICD-10-CM | POA: Diagnosis not present

## 2021-09-05 DIAGNOSIS — G43109 Migraine with aura, not intractable, without status migrainosus: Secondary | ICD-10-CM | POA: Diagnosis not present

## 2021-09-05 DIAGNOSIS — Z20822 Contact with and (suspected) exposure to covid-19: Secondary | ICD-10-CM | POA: Diagnosis not present

## 2021-09-05 DIAGNOSIS — H933X9 Disorders of unspecified acoustic nerve: Secondary | ICD-10-CM | POA: Diagnosis not present

## 2021-09-05 DIAGNOSIS — C228 Malignant neoplasm of liver, primary, unspecified as to type: Secondary | ICD-10-CM | POA: Diagnosis not present

## 2021-09-05 DIAGNOSIS — Z28310 Unvaccinated for covid-19: Secondary | ICD-10-CM | POA: Diagnosis not present

## 2021-09-05 DIAGNOSIS — Z8616 Personal history of COVID-19: Secondary | ICD-10-CM | POA: Diagnosis not present

## 2021-09-05 DIAGNOSIS — K660 Peritoneal adhesions (postprocedural) (postinfection): Secondary | ICD-10-CM | POA: Diagnosis not present

## 2021-09-19 DIAGNOSIS — I10 Essential (primary) hypertension: Secondary | ICD-10-CM | POA: Diagnosis not present

## 2021-09-19 DIAGNOSIS — Z9049 Acquired absence of other specified parts of digestive tract: Secondary | ICD-10-CM | POA: Diagnosis not present

## 2021-09-19 DIAGNOSIS — Q446 Cystic disease of liver: Secondary | ICD-10-CM | POA: Diagnosis not present

## 2021-09-19 DIAGNOSIS — Z6841 Body Mass Index (BMI) 40.0 and over, adult: Secondary | ICD-10-CM | POA: Diagnosis not present

## 2021-09-19 DIAGNOSIS — Z7901 Long term (current) use of anticoagulants: Secondary | ICD-10-CM | POA: Diagnosis not present

## 2021-09-19 DIAGNOSIS — Z48815 Encounter for surgical aftercare following surgery on the digestive system: Secondary | ICD-10-CM | POA: Diagnosis not present

## 2021-09-19 DIAGNOSIS — Z79899 Other long term (current) drug therapy: Secondary | ICD-10-CM | POA: Diagnosis not present

## 2021-10-04 DIAGNOSIS — I10 Essential (primary) hypertension: Secondary | ICD-10-CM | POA: Diagnosis not present

## 2021-10-04 DIAGNOSIS — A881 Epidemic vertigo: Secondary | ICD-10-CM | POA: Diagnosis not present

## 2021-10-04 DIAGNOSIS — D509 Iron deficiency anemia, unspecified: Secondary | ICD-10-CM | POA: Diagnosis not present

## 2021-10-04 DIAGNOSIS — R42 Dizziness and giddiness: Secondary | ICD-10-CM | POA: Diagnosis not present

## 2022-01-29 ENCOUNTER — Encounter: Payer: Self-pay | Admitting: Nurse Practitioner

## 2022-01-29 ENCOUNTER — Ambulatory Visit: Payer: BC Managed Care – PPO | Admitting: Nurse Practitioner

## 2022-01-29 VITALS — BP 122/86 | HR 100 | Temp 97.8°F | Resp 16 | Ht 65.0 in | Wt 292.0 lb

## 2022-01-29 DIAGNOSIS — J189 Pneumonia, unspecified organism: Secondary | ICD-10-CM | POA: Diagnosis not present

## 2022-01-29 DIAGNOSIS — R109 Unspecified abdominal pain: Secondary | ICD-10-CM | POA: Diagnosis not present

## 2022-01-29 DIAGNOSIS — R0981 Nasal congestion: Secondary | ICD-10-CM | POA: Diagnosis not present

## 2022-01-29 DIAGNOSIS — K7689 Other specified diseases of liver: Secondary | ICD-10-CM | POA: Diagnosis not present

## 2022-01-29 DIAGNOSIS — K769 Liver disease, unspecified: Secondary | ICD-10-CM | POA: Diagnosis not present

## 2022-01-29 DIAGNOSIS — Q446 Cystic disease of liver: Secondary | ICD-10-CM | POA: Diagnosis not present

## 2022-01-29 DIAGNOSIS — R051 Acute cough: Secondary | ICD-10-CM

## 2022-01-29 DIAGNOSIS — H8120 Vestibular neuronitis, unspecified ear: Secondary | ICD-10-CM

## 2022-01-29 DIAGNOSIS — C801 Malignant (primary) neoplasm, unspecified: Secondary | ICD-10-CM

## 2022-01-29 DIAGNOSIS — M545 Low back pain, unspecified: Secondary | ICD-10-CM

## 2022-01-29 DIAGNOSIS — D499 Neoplasm of unspecified behavior of unspecified site: Secondary | ICD-10-CM | POA: Diagnosis not present

## 2022-01-29 DIAGNOSIS — Z9049 Acquired absence of other specified parts of digestive tract: Secondary | ICD-10-CM | POA: Diagnosis not present

## 2022-01-29 MED ORDER — DOXYCYCLINE HYCLATE 100 MG PO TBEC: 100.0000 mg | DELAYED_RELEASE_TABLET | Freq: Two times a day (BID) | ORAL | 0 refills | Status: AC

## 2022-01-29 NOTE — Progress Notes (Signed)
New patient visit ? ?  ? ? ?Patient: Madison Murillo   DOB: Mar 20, 1976   46 y.o. Female  MRN: [id number] ?Visit Date: 02/01/2022 ? ? ?Assessment & Plan ? ?1. Community acquired pneumonia of right lower lobe of lung ?Continue to stay well hydrated to keep mucus thin and productive. ? ?- doxycycline (DORYX) 100 MG EC tablet; Take 1 tablet (100 mg total) by mouth 2 (two) times daily for 7 days.  Dispense: 14 tablet; Refill: 0 ? ?2. Acute cough ?OTC cough medications PRN. ? ?3. Nasal congestion ?OTC Mucinex PRN. ? ?5. Vestibular neuronitis, unspecified laterality ?Continue vestibular rehabilitation  ?[facility] Following. ? ?6.  Noninvasive mucinous cystic neoplasm ?[facility] following ? ? ?Today's healthcare provider: Darrol Jump, NP  ?Subjective:  ?  ?Chief Complaint  ?Patient presents with  ? New patient establishment  ? ? ?Madison Murillo is a 46 y.o. female who presents today as a new patient to establish care.  ? ?PMH includes vestibular neuritis, liver tumor (benign). She is followed by Dr. Colonel Bald, Hepatologist, [facility].  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 Pathology was negative for high grade dysplasia, negative for carcinoma, and resection margin was uninvolved.  After contacting Dr. Shayne Alken office on 01/23/22 for increase in pressure, burning, and pain in the area where her liver lesion was located (similar to her symptoms she had prior to surgery), she completed a CT abdomen on 01/29/22 for evaluation.  Imaging was reviewed by Dr. Colonel Bald. No concerning liver findings that would explain her pain. Plan to repeat CT abdomen in 6 months for surveillance. She did have an incidental finding of right lower lobe pneumonia and was asked to follow up with her PCP.    ? ?Today she endorses a productive cough, nasal congestion, fatigue. Denies fever, chills, N/V.    ? ?She has completed vestibular rehabilitation for tmt of vestibular neuritis.  She continues to  endorse occasional vertigo with nausea, vomiting, and gait instability. She has been treated in the past with glucocorticoids and antiviral agents, symptomatic treatments with  minimal effectiveness.  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  She follows with [facility]. ? ? ?Past Medical History:  ?Diagnosis Date  ? Preeclampsia   ? Vaginal delivery   ? ?Past Surgical History:  ?Procedure Laterality Date  ? ANKLE SURGERY Left   ? BIOPSY  04/06/2021  ? Procedure: BIOPSY;  Surgeon: Ronnette Juniper, MD;  Location: [facility];  Service: Gastroenterology;;  ? CHOLECYSTECTOMY    ? ESOPHAGOGASTRODUODENOSCOPY (EGD) WITH PROPOFOL N/A 04/06/2021  ? Procedure: ESOPHAGOGASTRODUODENOSCOPY (EGD) WITH PROPOFOL;  Surgeon: Ronnette Juniper, MD;  Location: [facility];  Service: Gastroenterology;  Laterality: N/A;  ? ?No family status information on file.  ? ?No family history on file. ?Social History  ? ?Socioeconomic History  ? Marital status: Married  ?  Spouse name: Not on file  ? Number of children: Not on file  ? Years of education: Not on file  ? Highest education level: Not on file  ?Occupational History  ? Not on file  ?Tobacco Use  ? Smoking status: Never  ? Smokeless tobacco: Never  ?Vaping Use  ? Vaping Use: Never used  ?Substance and Sexual Activity  ? Alcohol use: No  ? Drug use: No  ? Sexual activity: Not on file  ?Other Topics Concern  ? Not on file  ?Social History Narrative  ? Not on file  ? ?  Social Determinants of Health  ? ?Financial Resource Strain: Not on file  ?Food Insecurity: Not on file  ?Transportation Needs: Not on file  ?Physical Activity: Not on file  ?Stress: Not on file  ?Social Connections: Not on file  ? ?Outpatient Medications Prior to Visit  ?Medication Sig  ? amLODipine (NORVASC) 5 MG tablet Take 1 tablet (5 mg total) by mouth daily.  ? hydrochlorothiazide (MICROZIDE) 12.5 MG capsule Take 12.5 mg by mouth in the morning.  ? ibuprofen (ADVIL) 200 MG  tablet Take 600-800 mg by mouth every 6 (six) hours as needed for fever, headache or mild pain.  ? lisinopril (ZESTRIL) 20 MG tablet Take 20 mg by mouth in the morning.  ? gabapentin (NEURONTIN) 100 MG capsule Take 100 mg by mouth 3 (three) times daily as needed (neuropathy). (Patient not taking: Reported on 01/29/2022)  ? losartan (COZAAR) 50 MG tablet Take 1 tablet (50 mg total) by mouth daily. (Patient not taking: No sig reported)  ? meclizine (ANTIVERT) 25 MG tablet Take 1 tablet (25 mg total) by mouth 3 (three) times daily as needed for dizziness. (Patient not taking: Reported on 01/29/2022)  ? oxyCODONE-acetaminophen (PERCOCET/ROXICET) 5-325 MG tablet Take 1-2 tablets by mouth every 6 (six) hours as needed for severe pain. (Patient not taking: Reported on 07/19/2021)  ? traMADol (ULTRAM) 50 MG tablet Take 50 mg by mouth every 6 (six) hours as needed for moderate pain. (Patient not taking: Reported on 01/29/2022)  ? ?No facility-administered medications prior to visit.  ? ?Allergies  ?Allergen Reactions  ? Food Anaphylaxis  ?  J?cama ? ?  ? Codeine Hypertension  ?  hallucinations  ? Gluten Meal Nausea And Vomiting  ? ? ?Patient Care Team: ?Unk Pinto, MD as PCP - General (Internal Medicine) ? ?Review of Systems  ?Constitutional: Negative.   ?HENT:  Positive for congestion, rhinorrhea and sneezing.   ?Eyes: Negative.   ?Respiratory:  Positive for cough.   ?Cardiovascular: Negative.   ?Gastrointestinal: Negative.   ?Endocrine: Negative.   ?Genitourinary:   ?     RUQ pain  ?Musculoskeletal: Negative.   ?Skin: Negative.   ?Allergic/Immunologic: Negative.   ?Neurological:  Positive for dizziness.  ?Hematological: Negative.   ?Psychiatric/Behavioral: Negative.    ? ?Last CBC ?Lab Results  ?Component Value Date  ? WBC 8.4 07/19/2021  ? HGB 10.5 (L) 07/19/2021  ? HCT 34.4 (L) 07/19/2021  ? MCV 79.3 (L) 07/19/2021  ? MCH 24.2 (L) 07/19/2021  ? RDW 15.4 07/19/2021  ? PLT 346 07/19/2021  ? ?Last metabolic panel ?Lab  Results  ?Component Value Date  ? GLUCOSE 156 (H) 07/19/2021  ? NA 136 07/19/2021  ? K 3.3 (L) 07/19/2021  ? CL 101 07/19/2021  ? CO2 25 07/19/2021  ? BUN 8 07/19/2021  ? CREATININE 0.79 07/19/2021  ? GFRNONAA >60 07/19/2021  ? CALCIUM 9.0 07/19/2021  ? PROT 7.6 07/19/2021  ? ALBUMIN 4.0 07/19/2021  ? BILITOT 0.6 07/19/2021  ? ALKPHOS 95 07/19/2021  ? AST 12 (L) 07/19/2021  ? ALT 12 07/19/2021  ? ANIONGAP 10 07/19/2021  ? ?Last lipids ?No results found for: CHOL, HDL, LDLCALC, LDLDIRECT, TRIG, CHOLHDL ?Last hemoglobin A1c ?No results found for: HGBA1C ?Last thyroid functions ?No results found for: TSH, T3TOTAL, T4TOTAL, THYROIDAB ?Last vitamin D ?No results found for: 25OHVITD2, [place], VD25OH ?Last vitamin B12 and Folate ?No results found for: VITAMINB12, FOLATE ?  ?  ?Objective:  ?  ?BP 122/86   Pulse 100   Temp  97.8 ?F (36.6 ?C)   Resp 16   Ht '5\' 5"'$  (1.651 m)   Wt 292 lb (132.5 kg)   SpO2 96%   BMI 48.59 kg/m?  ?Physical Exam ?HENT:  ?   Head: Normocephalic and atraumatic.  ?   Right Ear: Tympanic membrane normal.  ?   Left Ear: Tympanic membrane normal.  ?   Nose: Congestion present.  ?   Mouth/Throat:  ?   Mouth: Mucous membranes are moist.  ?Eyes:  ?   Pupils: Pupils are equal, round, and reactive to light.  ?Cardiovascular:  ?   Rate and Rhythm: Normal rate.  ?   Pulses: Normal pulses.  ?   Heart sounds: Normal heart sounds.  ?Pulmonary:  ?   Effort: Pulmonary effort is normal.  ?   Breath sounds: Wheezing present.  ?   Comments: Diminished bases bilaterally ?Abdominal:  ?   Palpations: Abdomen is soft.  ?Musculoskeletal:     ?   General: Normal range of motion.  ?   Cervical back: Normal range of motion and neck supple.  ?Skin: ?   General: Skin is warm and dry.  ?Neurological:  ?   Mental Status: She is alert and oriented to person, place, and time.  ?Psychiatric:     ?   Mood and Affect: Mood normal.     ?   Behavior: Behavior normal.  ? ?No results found for any visits on 01/29/22. ? ?Darrol Jump, NP ? ? [facility] ?[phone] (phone) ?[phone] (fax) ? ?[facility] ?

## 2022-01-31 ENCOUNTER — Ambulatory Visit: Payer: BC Managed Care – PPO | Admitting: Nurse Practitioner

## 2022-02-01 ENCOUNTER — Ambulatory Visit: Payer: BC Managed Care – PPO | Admitting: Nurse Practitioner

## 2022-02-13 ENCOUNTER — Telehealth: Payer: Self-pay | Admitting: Nurse Practitioner

## 2022-02-13 NOTE — Telephone Encounter (Signed)
Patient states that she was on an antibiotic a couple weeks ago for an upper respiratory infection and now she has developed a yeast infection. Will you send in a prescription to resolve it to Krist[facility] Oppenheim at [facility]? ?

## 2022-02-14 ENCOUNTER — Other Ambulatory Visit: Payer: Self-pay | Admitting: Nurse Practitioner

## 2022-02-14 MED ORDER — NITROFURANTOIN MONOHYD MACRO 100 MG PO CAPS: 100.0000 mg | ORAL_CAPSULE | Freq: Two times a day (BID) | ORAL | 0 refills | Status: AC

## 2022-02-28 ENCOUNTER — Ambulatory Visit: Payer: BC Managed Care – PPO | Admitting: Nurse Practitioner

## 2022-06-12 ENCOUNTER — Encounter: Payer: Self-pay | Admitting: Nurse Practitioner

## 2022-06-12 ENCOUNTER — Ambulatory Visit: Payer: No Typology Code available for payment source | Admitting: Nurse Practitioner

## 2022-06-12 VITALS — BP 130/80 | HR 81 | Temp 97.9°F | Resp 17 | Ht 65.0 in | Wt 292.0 lb

## 2022-06-12 DIAGNOSIS — G43101 Migraine with aura, not intractable, with status migrainosus: Secondary | ICD-10-CM

## 2022-06-12 DIAGNOSIS — I10 Essential (primary) hypertension: Secondary | ICD-10-CM

## 2022-06-12 MED ORDER — PROMETHAZINE HCL 25 MG/ML IJ SOLN
25.0000 mg | Freq: Once | INTRAMUSCULAR | Status: AC
  Administered 2022-06-12: 25 mg via INTRAMUSCULAR

## 2022-06-12 MED ORDER — NURTEC 75 MG PO TBDP: 75.0000 mg | ORAL_TABLET | ORAL | 2 refills | Status: AC | PRN

## 2022-06-12 MED ORDER — KETOROLAC TROMETHAMINE 60 MG/2ML IM SOLN
60.0000 mg | Freq: Once | INTRAMUSCULAR | Status: AC
  Administered 2022-06-12: 60 mg via INTRAMUSCULAR

## 2022-06-12 NOTE — Patient Instructions (Signed)
Migraine Headache A migraine headache is an intense, throbbing pain on one side or both sides of the head. Migraine headaches may also cause other symptoms, such as nausea, vomiting, and sensitivity to light and noise. A migraine headache can last from 4 hours to 3 days. Talk with your doctor about what things may bring on (trigger) your migraine headaches. What are the causes? The exact cause of this condition is not known. However, a migraine may be caused when nerves in the brain become irritated and release chemicals that cause inflammation of blood vessels. This inflammation causes pain. This condition may be triggered or caused by: Drinking alcohol. Smoking. Taking medicines, such as: Medicine used to treat chest pain (nitroglycerin). Birth control pills. Estrogen. Certain blood pressure medicines. Eating or drinking products that contain nitrates, glutamate, aspartame, or tyramine. Aged cheeses, chocolate, or caffeine may also be triggers. Doing physical activity. Other things that may trigger a migraine headache include: Menstruation. Pregnancy. Hunger. Stress. Lack of sleep or too much sleep. Weather changes. Fatigue. What increases the risk? The following factors may make you more likely to experience migraine headaches: Being a certain age. This condition is more common in people who are 25-55 years old. Being female. Having a family history of migraine headaches. Being Caucasian. Having a mental health condition, such as depression or anxiety. Being obese. What are the signs or symptoms? The main symptom of this condition is pulsating or throbbing pain. This pain may: Happen in any area of the head, such as on one side or both sides. Interfere with daily activities. Get worse with physical activity. Get worse with exposure to bright lights or loud noises. Other symptoms may include: Nausea. Vomiting. Dizziness. General sensitivity to bright lights, loud noises, or  smells. Before you get a migraine headache, you may get warning signs (an aura). An aura may include: Seeing flashing lights or having blind spots. Seeing bright spots, halos, or zigzag lines. Having tunnel vision or blurred vision. Having numbness or a tingling feeling. Having trouble talking. Having muscle weakness. Some people have symptoms after a migraine headache (postdromal phase), such as: Feeling tired. Difficulty concentrating. How is this diagnosed? A migraine headache can be diagnosed based on: Your symptoms. A physical exam. Tests, such as: CT scan or an MRI of the head. These imaging tests can help rule out other causes of headaches. Taking fluid from the spine (lumbar puncture) and analyzing it (cerebrospinal fluid analysis, or CSF analysis). How is this treated? This condition may be treated with medicines that: Relieve pain. Relieve nausea. Prevent migraine headaches. Treatment for this condition may also include: Acupuncture. Lifestyle changes like avoiding foods that trigger migraine headaches. Biofeedback. Cognitive behavioral therapy. Follow these instructions at home: Medicines Take over-the-counter and prescription medicines only as told by your health care provider. Ask your health care provider if the medicine prescribed to you: Requires you to avoid driving or using heavy machinery. Can cause constipation. You may need to take these actions to prevent or treat constipation: Drink enough fluid to keep your urine pale yellow. Take over-the-counter or prescription medicines. Eat foods that are high in fiber, such as beans, whole grains, and fresh fruits and vegetables. Limit foods that are high in fat and processed sugars, such as fried or sweet foods. Lifestyle Do not drink alcohol. Do not use any products that contain nicotine or tobacco, such as cigarettes, e-cigarettes, and chewing tobacco. If you need help quitting, ask your health care  provider. Get at least 8   hours of sleep every night. Find ways to manage stress, such as meditation, deep breathing, or yoga. General instructions     Keep a journal to find out what may trigger your migraine headaches. For example, write down: What you eat and drink. How much sleep you get. Any change to your diet or medicines. If you have a migraine headache: Avoid things that make your symptoms worse, such as bright lights. It may help to lie down in a dark, quiet room. Do not drive or use heavy machinery. Ask your health care provider what activities are safe for you while you are experiencing symptoms. Keep all follow-up visits as told by your health care provider. This is important. Contact a health care provider if: You develop symptoms that are different or more severe than your usual migraine headache symptoms. You have more than 15 headache days in one month. Get help right away if: Your migraine headache becomes severe. Your migraine headache lasts longer than 72 hours. You have a fever. You have a stiff neck. You have vision loss. Your muscles feel weak or like you cannot control them. You start to lose your balance often. You have trouble walking. You faint. You have a seizure. Summary A migraine headache is an intense, throbbing pain on one side or both sides of the head. Migraines may also cause other symptoms, such as nausea, vomiting, and sensitivity to light and noise. This condition may be treated with medicines and lifestyle changes. You may also need to avoid certain things that trigger a migraine headache. Keep a journal to find out what may trigger your migraine headaches. Contact your health care provider if you have more than 15 headache days in a month or you develop symptoms that are different or more severe than your usual migraine headache symptoms. This information is not intended to replace advice given to you by your health care provider. Make sure  you discuss any questions you have with your health care provider. Document Revised: 01/30/2019 Document Reviewed: 11/20/2018 Elsevier Patient Education  2023 [employer] Inc[employer]  

## 2022-06-12 NOTE — Progress Notes (Signed)
Assessment and Plan:  There are no diagnoses linked to this encounter.  Helem was seen today for migraine.  Diagnoses and all orders for this visit:  Migraine with aura and with status migrainosus, not intractable Given Nurtec, toradol 60 mg IM and Phenergan 25 mg IM  Encouraged to have husband drive her homre and attempt to fall asleep to help reset brain to resolve migraine If migraine is not improving or worsens she is to go to the ER -     Rimegepant Sulfate (NURTEC) 75 MG TBDP; Take 75 mg by mouth as needed (Take 1 tab as needed with onset of migraine). -     promethazine (PHENERGAN) injection 25 mg -     ketorolac (TORADOL) injection 60 mg  Essential hypertension - continue medications, DASH diet, exercise and monitor at home. Call if greater than 130/80.   Go to the ER if any chest pain, shortness of breath, nausea, dizziness, severe HA, changes vision/speech      Further disposition pending results of labs. Discussed med's effects and SE's.   Over 30 minutes of exam, counseling, chart review, and critical decision making was performed.   No future appointments.   ------------------------------------------------------------------------------------------------------------------   HPI BP 130/80   Pulse 81   Temp 97.9 F (36.6 C)   Resp 17   Ht '5\' 5"'$  (1.651 m)   Wt 292 lb (132.5 kg)   SpO2 96%   BMI 48.59 kg/m   46 y.o.female presents for migraine . 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 Several years ago when BP got high speech was slurred speech, horrific head pain. She had a lot of stress today at work.  Pain began about 12:30- took Advil with no relief and BP medication. She has never used triptans in the past. She is currently having light/sound sensitivity with some nausea.   She is currently on Amlodipine '5mg'$  QD, HCTZ 12.5 mg QD and Lisinopril 20 mg QD.  Denies chest pain, shortness of breath and dizziness. BP Readings from Last 3 Encounters:  06/12/22  130/80  01/29/22 122/86  07/19/21 (!) 140/94     BMI is Body mass index is 48.59 kg/m., she has not been working on diet and exercise. Wt Readings from Last 3 Encounters:  06/12/22 292 lb (132.5 kg)  01/29/22 292 lb (132.5 kg)  07/19/21 291 lb (132 kg)    Past Medical History:  Diagnosis Date   Preeclampsia    Vaginal delivery      Allergies  Allergen Reactions   Food Anaphylaxis    Jcama     Codeine Hypertension    hallucinations   Gluten Meal Nausea And Vomiting    Current Outpatient Medications on File Prior to Visit  Medication Sig   amLODipine (NORVASC) 5 MG tablet Take 1 tablet (5 mg total) by mouth daily.   hydrochlorothiazide (MICROZIDE) 12.5 MG capsule Take 12.5 mg by mouth in the morning.   ibuprofen (ADVIL) 200 MG tablet Take 600-800 mg by mouth every 6 (six) hours as needed for fever, headache or mild pain.   lisinopril (ZESTRIL) 20 MG tablet Take 20 mg by mouth in the morning.   gabapentin (NEURONTIN) 100 MG capsule Take 100 mg by mouth 3 (three) times daily as needed (neuropathy). (Patient not taking: Reported on 01/29/2022)   losartan (COZAAR) 50 MG tablet Take 1 tablet (50 mg total) by mouth daily. (Patient not taking: No sig reported)   meclizine (ANTIVERT) 25 MG tablet Take 1 tablet (25  mg total) by mouth 3 (three) times daily as needed for dizziness. (Patient not taking: Reported on 01/29/2022)   oxyCODONE-acetaminophen (PERCOCET/ROXICET) 5-325 MG tablet Take 1-2 tablets by mouth every 6 (six) hours as needed for severe pain. (Patient not taking: Reported on 07/19/2021)   traMADol (ULTRAM) 50 MG tablet Take 50 mg by mouth every 6 (six) hours as needed for moderate pain. (Patient not taking: Reported on 01/29/2022)   No current facility-administered medications on file prior to visit.    ROS: all negative except above.   Physical Exam:  BP 130/80   Pulse 81   Temp 97.9 F (36.6 C)   Resp 17   Ht '5\' 5"'$  (1.651 m)   Wt 292 lb (132.5 kg)   SpO2 96%    BMI 48.59 kg/m   General Appearance: Tearful female wearing sunglass- appears in pain Eyes: PERRLA, EOMs, conjunctiva no swelling or erythema Sinuses: No Frontal/maxillary tenderness ENT/Mouth: Ext aud canals clear, TMs without erythema, bulging. No erythema, swelling, or exudate on post pharynx.  Tonsils not swollen or erythematous. Hearing normal.  Neck: Supple, thyroid normal.  Respiratory: Respiratory effort normal, BS equal bilaterally without rales, rhonchi, wheezing or stridor.  Cardio: RRR with no MRGs. Brisk peripheral pulses without edema.  Abdomen: Soft, + BS.  Non tender, no guarding, rebound, hernias, masses. Lymphatics: Non tender without lymphadenopathy.  Musculoskeletal: Full ROM, 5/5 strength, normal gait.  Skin: Warm, dry without rashes, lesions, ecchymosis.  Neuro: Cranial nerves intact. Normal muscle tone, no cerebellar symptoms. Sensation intact.  Psych: Awake and oriented X 3, normal affect, Insight and Judgment appropriate.     Alycia Rossetti, NP 2:28 PM [facility]

## 2022-06-28 ENCOUNTER — Telehealth: Payer: Self-pay

## 2022-06-28 NOTE — Telephone Encounter (Signed)
Prior auth for Nurtec denied.

## 2022-07-20 ENCOUNTER — Ambulatory Visit: Payer: Self-pay | Admitting: Nurse Practitioner

## 2022-07-23 ENCOUNTER — Encounter: Payer: Self-pay | Admitting: Nurse Practitioner

## 2022-07-23 ENCOUNTER — Ambulatory Visit (INDEPENDENT_AMBULATORY_CARE_PROVIDER_SITE_OTHER): Payer: Self-pay | Admitting: Nurse Practitioner

## 2022-07-23 VITALS — BP 144/91 | HR 82 | Temp 97.2°F | Ht 65.0 in | Wt 317.4 lb

## 2022-07-23 DIAGNOSIS — R5383 Other fatigue: Secondary | ICD-10-CM

## 2022-07-23 DIAGNOSIS — Z8639 Personal history of other endocrine, nutritional and metabolic disease: Secondary | ICD-10-CM

## 2022-07-23 DIAGNOSIS — R7309 Other abnormal glucose: Secondary | ICD-10-CM

## 2022-07-23 DIAGNOSIS — Z79899 Other long term (current) drug therapy: Secondary | ICD-10-CM

## 2022-07-23 DIAGNOSIS — I10 Essential (primary) hypertension: Secondary | ICD-10-CM

## 2022-07-23 NOTE — Progress Notes (Unsigned)
Assessment and Plan:  Madison Murillo was seen today for an episodic visit.  Diagnoses and all order for this visit:  There are no diagnoses linked to this encounter.   Continue to monitor for any increase in fever, chills, N/V, diarrhea, changes to bowel habits, blood in stool.  Notify office for further evaluation and treatment, questions or concerns if s/s fail to improve. The risks and benefits of my recommendations, as well as other treatment options were discussed with the patient today. Questions were answered.  Further disposition pending results of labs. Discussed med's effects and SE's.    Over *** minutes of exam, counseling, chart review, and critical decision making was performed.   Future Appointments  Date Time Provider [place]  07/23/2022  4:00 PM Darrol Jump, NP [facility] None  09/19/2022  2:00 PM Alycia Rossetti, NP [facility] None    ------------------------------------------------------------------------------------------------------------------   HPI There were no vitals taken for this visit. 46 y.o.female presents for  Past Medical History:  Diagnosis Date   Preeclampsia    Vaginal delivery      Allergies  Allergen Reactions   Food Anaphylaxis    Jcama     Codeine Hypertension    hallucinations   Gluten Meal Nausea And Vomiting    Current Outpatient Medications on File Prior to Visit  Medication Sig   amLODipine (NORVASC) 5 MG tablet Take 1 tablet (5 mg total) by mouth daily.   gabapentin (NEURONTIN) 100 MG capsule Take 100 mg by mouth 3 (three) times daily as needed (neuropathy). (Patient not taking: Reported on 01/29/2022)   hydrochlorothiazide (MICROZIDE) 12.5 MG capsule Take 12.5 mg by mouth in the morning.   ibuprofen (ADVIL) 200 MG tablet Take 600-800 mg by mouth every 6 (six) hours as needed for fever, headache or mild pain.   lisinopril (ZESTRIL) 20 MG tablet Take 20 mg by mouth in the morning.   losartan (COZAAR) 50 MG  tablet Take 1 tablet (50 mg total) by mouth daily. (Patient not taking: No sig reported)   meclizine (ANTIVERT) 25 MG tablet Take 1 tablet (25 mg total) by mouth 3 (three) times daily as needed for dizziness. (Patient not taking: Reported on 01/29/2022)   oxyCODONE-acetaminophen (PERCOCET/ROXICET) 5-325 MG tablet Take 1-2 tablets by mouth every 6 (six) hours as needed for severe pain. (Patient not taking: Reported on 07/19/2021)   Rimegepant Sulfate (NURTEC) 75 MG TBDP Take 75 mg by mouth as needed (Take 1 tab as needed with onset of migraine).   traMADol (ULTRAM) 50 MG tablet Take 50 mg by mouth every 6 (six) hours as needed for moderate pain. (Patient not taking: Reported on 01/29/2022)   No current facility-administered medications on file prior to visit.    ROS: all negative except what is noted in the HPI.   Physical Exam:  There were no vitals taken for this visit.  General Appearance: NAD.  Awake, conversant and cooperative. Eyes: PERRLA, EOMs intact.  Sclera white.  Conjunctiva without erythema. Sinuses: No frontal/maxillary tenderness.  No nasal discharge. Nares patent.  ENT/Mouth: Ext aud canals clear.  Bilateral TMs w/DOL and without erythema or bulging. Hearing intact.  Posterior pharynx without swelling or exudate.  Tonsils without swelling or erythema.  Neck: Supple.  No masses, nodules or thyromegaly. Respiratory: Effort is regular with non-labored breathing. Breath sounds are equal bilaterally without rales, rhonchi, wheezing or stridor.  Cardio: RRR with no MRGs. Brisk peripheral pulses without edema.  Abdomen: Active BS in all four quadrants.  Soft  and non-tender without guarding, rebound tenderness, hernias or masses. Lymphatics: Non tender without lymphadenopathy.  Musculoskeletal: Full ROM, 5/5 strength, normal ambulation.  No clubbing or cyanosis. Skin: Appropriate color for ethnicity. Warm without rashes, lesions, ecchymosis, ulcers.  Neuro: CN II-XII grossly normal.  Normal muscle tone without cerebellar symptoms and intact sensation.   Psych: AO X 3,  appropriate mood and affect, insight and judgment.     Darrol Jump, NP 1:44 PM [facility]

## 2022-07-23 NOTE — Patient Instructions (Signed)

## 2022-07-24 ENCOUNTER — Telehealth: Payer: Self-pay | Admitting: Nurse Practitioner

## 2022-07-24 ENCOUNTER — Other Ambulatory Visit: Payer: Self-pay | Admitting: Nurse Practitioner

## 2022-07-24 ENCOUNTER — Encounter: Payer: Self-pay | Admitting: Nurse Practitioner

## 2022-07-24 LAB — COMPLETE METABOLIC PANEL WITH GFR
AG Ratio: 1.3 (calc) (ref 1.0–2.5)
ALT: 7 U/L (ref 6–29)
AST: 5 U/L — ABNORMAL LOW (ref 10–35)
Albumin: 4 g/dL (ref 3.6–5.1)
Alkaline phosphatase (APISO): 87 U/L (ref 31–125)
BUN/Creatinine Ratio: 14 (calc) (ref 6–22)
BUN: 15 mg/dL (ref 7–25)
CO2: 25 mmol/L (ref 20–32)
Calcium: 9.2 mg/dL (ref 8.6–10.2)
Chloride: 105 mmol/L (ref 98–110)
Creat: 1.07 mg/dL — ABNORMAL HIGH (ref 0.50–0.99)
Globulin: 3.1 g/dL (calc) (ref 1.9–3.7)
Glucose, Bld: 87 mg/dL (ref 65–99)
Potassium: 4.3 mmol/L (ref 3.5–5.3)
Sodium: 139 mmol/L (ref 135–146)
Total Bilirubin: 0.4 mg/dL (ref 0.2–1.2)
Total Protein: 7.1 g/dL (ref 6.1–8.1)
eGFR: 64 mL/min/{1.73_m2} (ref >=60)

## 2022-07-24 LAB — CBC WITH DIFFERENTIAL/PLATELET
Absolute Monocytes: 400 cells/uL (ref 200–950)
Basophils Absolute: 43 cells/uL (ref 0–200)
Basophils Relative: 0.5 %
Eosinophils Absolute: 323 cells/uL (ref 15–500)
Eosinophils Relative: 3.8 %
HCT: 34.4 % — ABNORMAL LOW (ref 35.0–45.0)
Hemoglobin: 10.4 g/dL — ABNORMAL LOW (ref 11.7–15.5)
Lymphs Abs: 1870 cells/uL (ref 850–3900)
MCH: 23 pg — ABNORMAL LOW (ref 27.0–33.0)
MCHC: 30.2 g/dL — ABNORMAL LOW (ref 32.0–36.0)
MCV: 75.9 fL — ABNORMAL LOW (ref 80.0–100.0)
MPV: 12.1 fL (ref 7.5–12.5)
Monocytes Relative: 4.7 %
Neutro Abs: 5865 cells/uL (ref 1500–7800)
Neutrophils Relative %: 69 %
Platelets: 274 10*3/uL (ref 140–400)
RBC: 4.53 10*6/uL (ref 3.80–5.10)
RDW: 16.3 % — ABNORMAL HIGH (ref 11.0–15.0)
Total Lymphocyte: 22 %
WBC: 8.5 10*3/uL (ref 3.8–10.8)

## 2022-07-24 LAB — IRON,TIBC AND FERRITIN PANEL
%SAT: 13 % (calc) — ABNORMAL LOW (ref 16–45)
Ferritin: 9 ng/mL — ABNORMAL LOW (ref 16–232)
Iron: 50 ug/dL (ref 40–190)
TIBC: 394 mcg/dL (calc) (ref 250–450)

## 2022-07-24 LAB — HEMOGLOBIN A1C
Hgb A1c MFr Bld: 5.6 % of total Hgb (ref <5.7)
Mean Plasma Glucose: 114 mg/dL
eAG (mmol/L): 6.3 mmol/L

## 2022-07-24 MED ORDER — FERROUS SULFATE 325 (65 FE) MG PO TABS: 325.0000 mg | ORAL_TABLET | Freq: Three times a day (TID) | ORAL | 0 refills | Status: AC

## 2022-07-24 NOTE — Telephone Encounter (Signed)
Pt received lab results this morning and I told her that mychart sends the pt and the provider the same lab results at the same time. I asked her to give tonya time and that she will respond to her on mychart

## 2022-08-27 DIAGNOSIS — Z9049 Acquired absence of other specified parts of digestive tract: Secondary | ICD-10-CM | POA: Diagnosis not present

## 2022-08-27 DIAGNOSIS — K769 Liver disease, unspecified: Secondary | ICD-10-CM | POA: Diagnosis not present

## 2022-08-27 DIAGNOSIS — K7689 Other specified diseases of liver: Secondary | ICD-10-CM | POA: Diagnosis not present

## 2022-09-18 NOTE — Progress Notes (Deleted)
 Complete Physical  Assessment and Plan:  Discussed med's effects and SE's. Screening labs and tests as requested with regular follow-up as recommended. Over 40 minutes of exam, counseling, chart review, and complex, high level critical decision making was performed this visit.   HPI  46 y.o. female  presents for a complete physical and follow up for has Essential hypertension and Migraine with aura and with status migrainosus, not intractable on their problem list..  Her blood pressure has been controlled at home, today their BP is    BP Readings from Last 3 Encounters:  07/23/22 (!) 144/91  06/12/22 130/80  01/29/22 122/86  She {DOES_DOES KGY:18563} workout. She denies chest pain, shortness of breath, dizziness.   BMI is There is no height or weight on file to calculate BMI., she {HAS HAS JSH:70263} been working on diet and exercise. Wt Readings from Last 3 Encounters:  07/23/22 (!) 317 lb 6.4 oz (144 kg)  06/12/22 292 lb (132.5 kg)  01/29/22 292 lb (132.5 kg)      She is not on cholesterol medication and denies myalgias.   She {Has/has not:18111} been working on diet and exercise for abnormal glucoseshe {ACTION; IS/IS NOT:21021397} on bASA, she {ACTION; IS/IS NOT:21021397} on ACE/ARB and denies {Symptoms; diabetes w/o none:19199}. Last A1C in the office was:  Lab Results  Component Value Date   HGBA1C 5.6 07/23/2022    Last GFR: Lab Results  Component Value Date   EGFR 64 07/23/2022    Patient is on Vitamin D supplement.   No results found for: "VD25OH"    Current Medications:  Current Outpatient Medications on File Prior to Visit  Medication Sig Dispense Refill   amLODipine (NORVASC) 5 MG tablet Take 1 tablet (5 mg total) by mouth daily. 30 tablet 0   ferrous sulfate 325 (65 FE) MG tablet Take 1 tablet (325 mg total) by mouth in the morning, at noon, and at bedtime. 90 tablet 0   gabapentin (NEURONTIN) 100 MG capsule Take 100 mg by mouth 3 (three) times daily as  needed (neuropathy).     hydrochlorothiazide (MICROZIDE) 12.5 MG capsule Take 12.5 mg by mouth in the morning.     ibuprofen (ADVIL) 200 MG tablet Take 600-800 mg by mouth every 6 (six) hours as needed for fever, headache or mild pain.     lisinopril (ZESTRIL) 20 MG tablet Take 20 mg by mouth in the morning.     losartan (COZAAR) 50 MG tablet Take 1 tablet (50 mg total) by mouth daily. 30 tablet 0   meclizine (ANTIVERT) 25 MG tablet Take 1 tablet (25 mg total) by mouth 3 (three) times daily as needed for dizziness. 30 tablet 0   oxyCODONE-acetaminophen (PERCOCET/ROXICET) 5-325 MG tablet Take 1-2 tablets by mouth every 6 (six) hours as needed for severe pain. 10 tablet 0   Rimegepant Sulfate (NURTEC) 75 MG TBDP Take 75 mg by mouth as needed (Take 1 tab as needed with onset of migraine). 8 tablet 2   traMADol (ULTRAM) 50 MG tablet Take 50 mg by mouth every 6 (six) hours as needed for moderate pain.     No current facility-administered medications on file prior to visit.   Allergies:  Allergies  Allergen Reactions   Food Anaphylaxis    Jcama     Codeine Hypertension    hallucinations   Gluten Meal Nausea And Vomiting   Medical History:  She has Essential hypertension and Migraine with aura and with status migrainosus, not intractable on their  problem list. Health Maintenance:    There is no immunization history on file for this patient.  Tetanus: Pneumovax: Prevnar 13:  Flu vaccine: Zostavax: LMP: Pap: MGM:  DEXA: Colonoscopy: EGD:  Last Dental Exam: Last Eye Exam: Patient Care Team: Unk Pinto, MD as PCP - General (Internal Medicine)  Surgical History:  She has a past surgical history that includes Cholecystectomy; Ankle surgery (Left); Esophagogastroduodenoscopy (egd) with propofol (N/A, 04/06/2021); and biopsy (04/06/2021). Family History:  Herfamily history is not on file. Social History:  She reports that she has never smoked. She has never used smokeless  tobacco. She reports that she does not drink alcohol and does not use drugs.  Review of Systems: ROS  Physical Exam: Estimated body mass index is 52.82 kg/m as calculated from the following:   Height as of 07/23/22: _0  (1.651 m).   Weight as of 07/23/22: 317 lb 6.4 oz (144 kg). There were no vitals taken for this visit. General Appearance: Well nourished, in no apparent distress.  Eyes: PERRLA, EOMs, conjunctiva no swelling or erythema, normal fundi and vessels.  Sinuses: No Frontal/maxillary tenderness  ENT/Mouth: Ext aud canals clear, normal light reflex with TMs without erythema, bulging. Good dentition. No erythema, swelling, or exudate on post pharynx. Tonsils not swollen or erythematous. Hearing normal.  Neck: Supple, thyroid normal. No bruits  Respiratory: Respiratory effort normal, BS equal bilaterally without rales, rhonchi, wheezing or stridor.  Cardio: RRR without murmurs, rubs or gallops. Brisk peripheral pulses without edema.  Chest: symmetric, with normal excursions and percussion.  Breasts: Symmetric, without lumps, nipple discharge, retractions.  Abdomen: Positive bowel sounds all 4 quadrants, Soft, nontender, no guarding, rebound, hernias, masses, or organomegaly.  Lymphatics: Non tender without lymphadenopathy.  Genitourinary: External genitalia without erythema, exudate or discharge. Cervix noraml color without lesions, os closed.  Uterus of normal size and nontender. No CMT noted. Adnexa without masses or tenderness. Musculoskeletal: Full ROM all peripheral extremities,5/5 strength, and normal gait.  Skin: Warm, dry without rashes, lesions, ecchymosis. Neuro: Cranial nerves intact, reflexes equal bilaterally. Normal muscle tone, no cerebellar symptoms. Sensation intact.  Psych: Awake and oriented X 3, normal affect, Insight and Judgment appropriate.   EKG: WNL no ST changes. AORTA SCAN: WNL   Audreyana Huntsberry E  12:57 PM [facility]

## 2022-09-19 ENCOUNTER — Encounter: Payer: Self-pay | Admitting: Nurse Practitioner

## 2022-09-19 DIAGNOSIS — I10 Essential (primary) hypertension: Secondary | ICD-10-CM

## 2022-09-19 DIAGNOSIS — Z13220 Encounter for screening for lipoid disorders: Secondary | ICD-10-CM

## 2022-09-19 DIAGNOSIS — R7309 Other abnormal glucose: Secondary | ICD-10-CM

## 2022-09-19 DIAGNOSIS — Z1329 Encounter for screening for other suspected endocrine disorder: Secondary | ICD-10-CM

## 2022-09-19 DIAGNOSIS — Z79899 Other long term (current) drug therapy: Secondary | ICD-10-CM

## 2022-09-19 DIAGNOSIS — E559 Vitamin D deficiency, unspecified: Secondary | ICD-10-CM

## 2022-09-19 DIAGNOSIS — Z136 Encounter for screening for cardiovascular disorders: Secondary | ICD-10-CM

## 2022-09-19 DIAGNOSIS — Z8639 Personal history of other endocrine, nutritional and metabolic disease: Secondary | ICD-10-CM

## 2022-09-19 DIAGNOSIS — Z0001 Encounter for general adult medical examination with abnormal findings: Secondary | ICD-10-CM

## 2022-09-19 DIAGNOSIS — Z1389 Encounter for screening for other disorder: Secondary | ICD-10-CM

## 2022-09-20 IMAGING — CT CT ABD-PELV W/ CM
2 of 5 series · 15 of 46 positions shown, 17 images · IV contrast (OMNIPAQUE 350)
Comparison: CT 12/18/2020, MR 01/02/2021

CLINICAL DATA: Right upper quadrant abdominal pain, liver tumor
scheduled for removal 06/21/2021, acutely worsening pain/symptoms

EXAM:
CT ABDOMEN AND PELVIS WITH CONTRAST
TECHNIQUE: Multidetector CT imaging of the abdomen and pelvis was performed
using the standard protocol following bolus administration of
intravenous contrast.
CONTRAST:  100mL OMNIPAQUE IOHEXOL 350 MG/ML SOLN

[Series 2: axial st · axial · 0.86mm/px · z∈[+1141,+1511]mm · 12 of 88 slices shown, 14 images]
[im 7/88  soft-tissue]
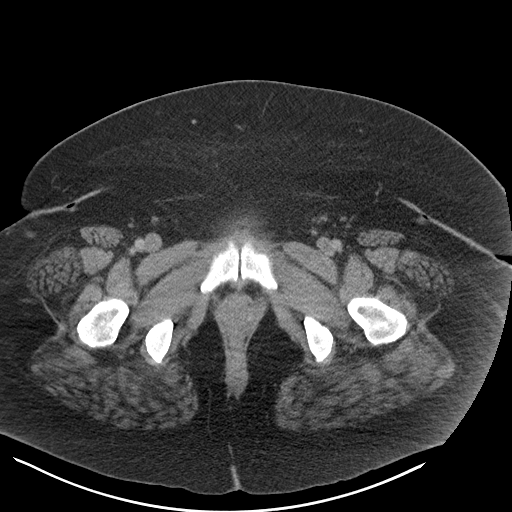
[im 7/88  bone]
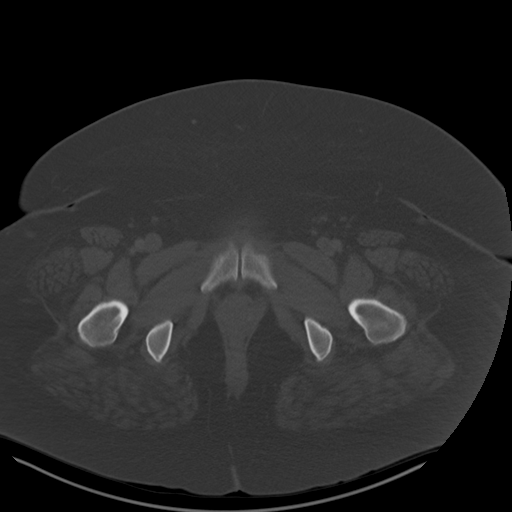
[im 13/88  soft-tissue]
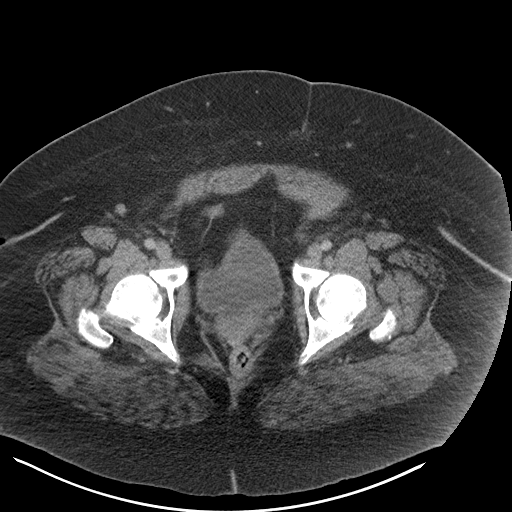
[im 19/88  soft-tissue]
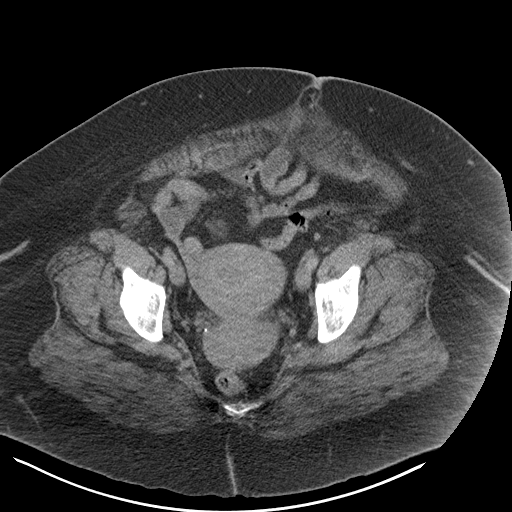
[im 25/88  soft-tissue]
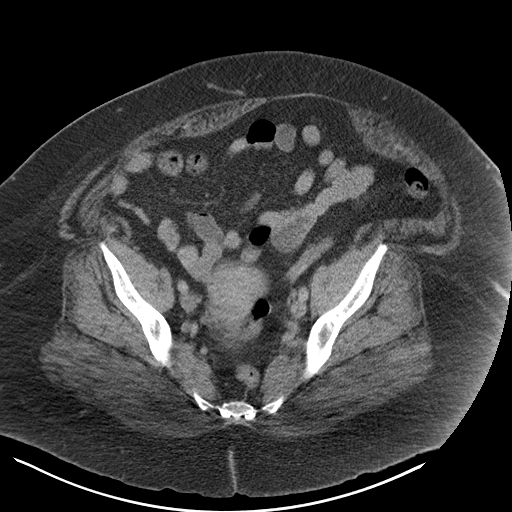
[im 32/88  soft-tissue]
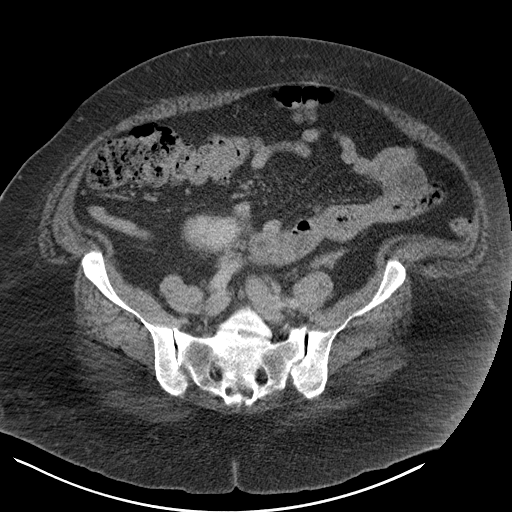
[im 38/88  soft-tissue]
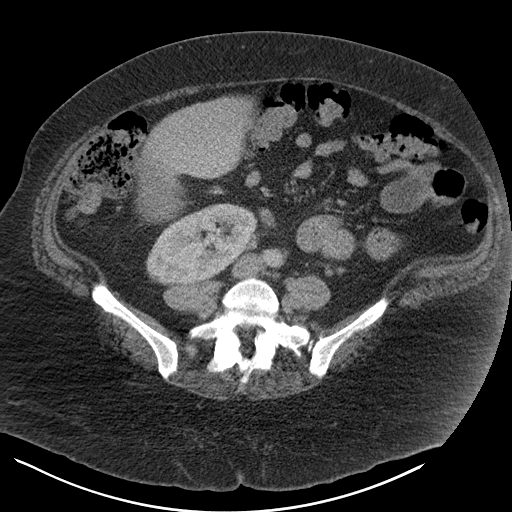
[im 50/88  soft-tissue]
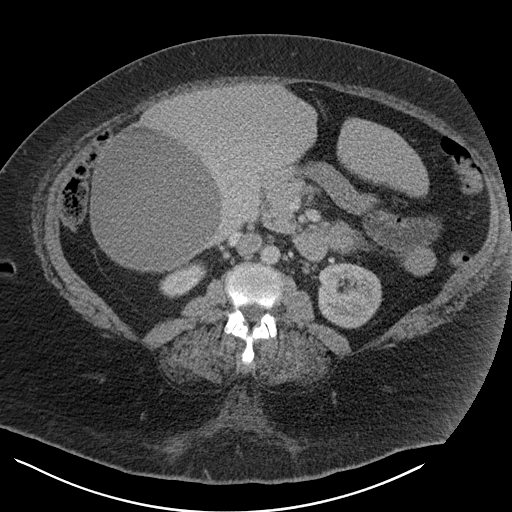
[im 56/88  soft-tissue]
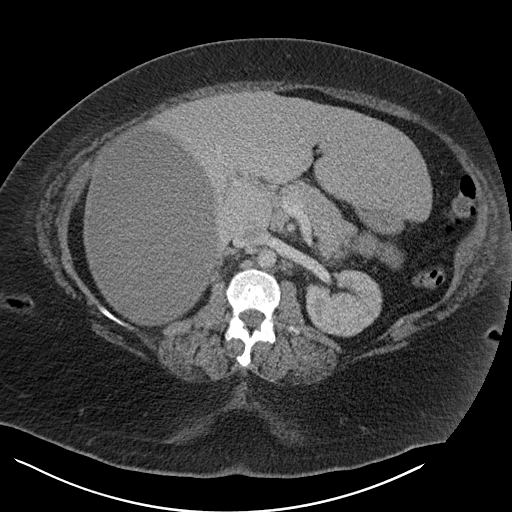
[im 63/88  soft-tissue]
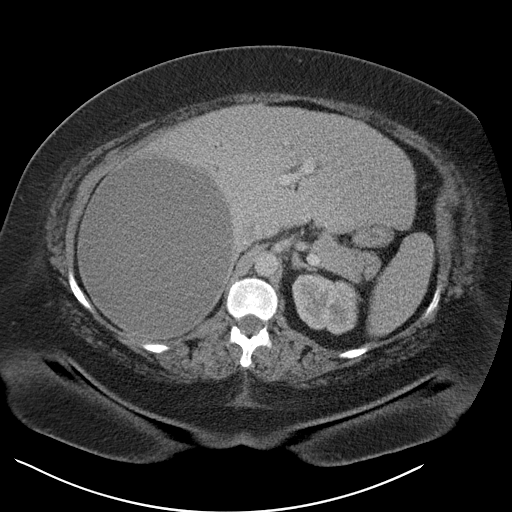
[im 63/88  bone]
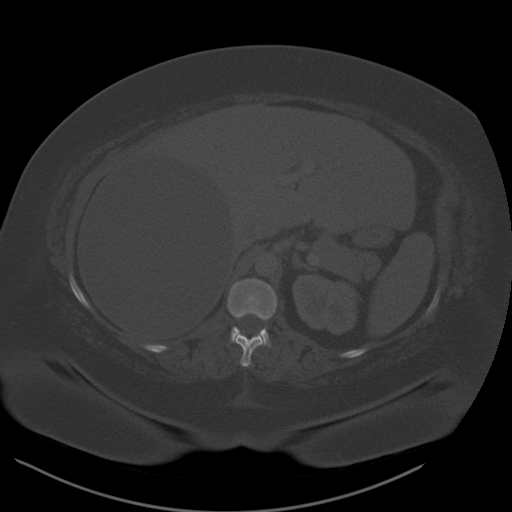
[im 69/88  soft-tissue]
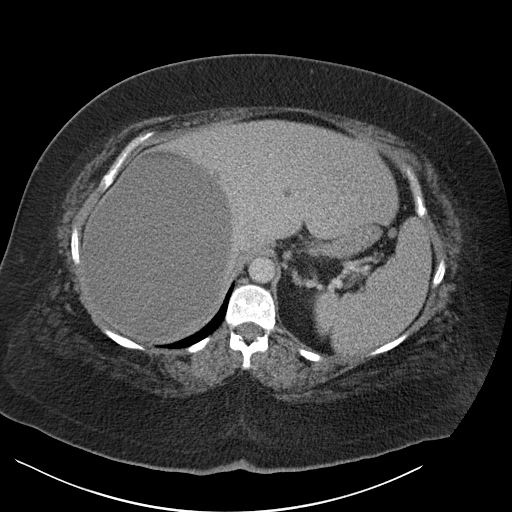
[im 75/88  soft-tissue]
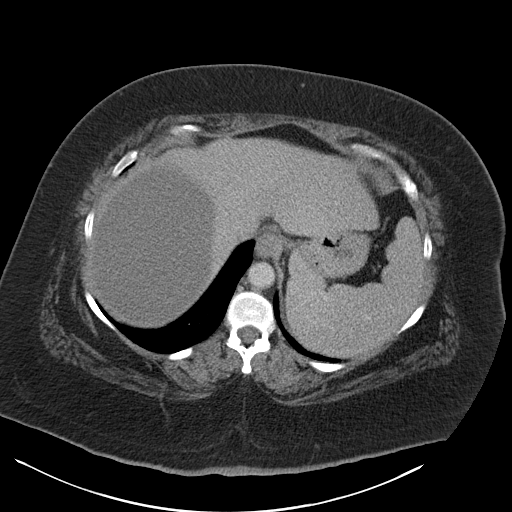
[im 81/88  soft-tissue]
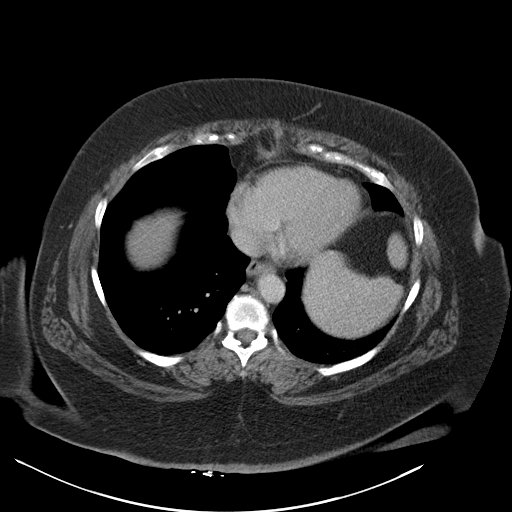

[Series 4: coronal st · coronal · 0.86mm/px · 3 of 184 slices shown]
[im 62/184  soft-tissue]
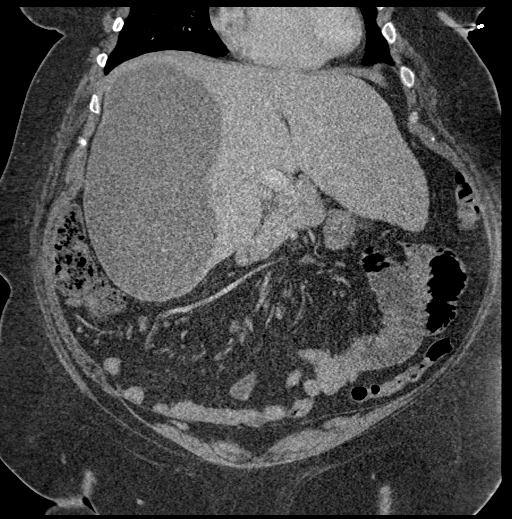
[im 82/184  soft-tissue]
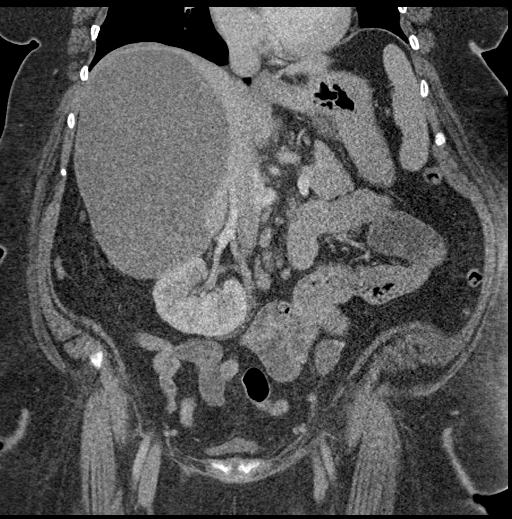
[im 102/184  soft-tissue]
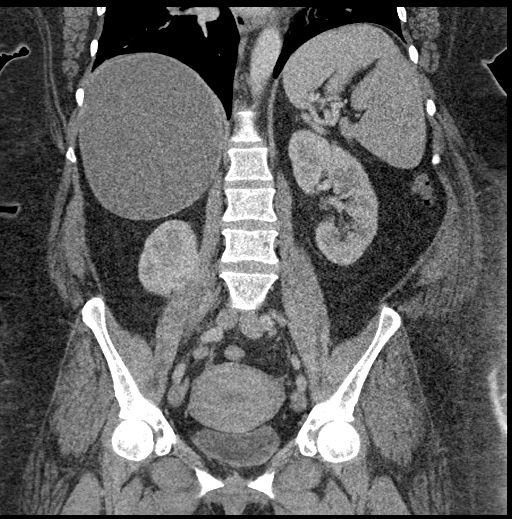

[15 of 46 positions shown; findings below may reference images not displayed]

FINDINGS: Lower chest: Atelectatic changes in the lung bases.

Hepatobiliary: Redemonstration of the large cystic lesion occupying
much of the right lobe liver measuring up to 16 x 14 x 21 cm in size
which is fairly similar to comparison prior counting for differences
in technique. Intermediate attenuation of the majority of this
cystic lesion with more low-attenuation gradient seen anteriorly as
well as at least 2 small internal rounded hypoattenuating foci
measuring up to 1.2 and 1.4 cm respectively ([DATE]) corresponding
with areas of more intrinsic T2 hyper attenuation on comparison MRI.
No other focal liver lesion. Prior cholecystectomy. No significant
biliary ductal dilatation accounting for reservoir effect. No
visible intraductal gallstones.

Pancreas: No pancreatic ductal dilatation or surrounding
inflammatory changes.

Spleen: Normal in size. No concerning splenic lesions.

Adrenals/Urinary Tract: Normal adrenal glands. Kidneys are normally
located with symmetric enhancementand excretion. Few subcentimeter
hypoattenuating foci in the kidneys, too small to fully characterize
on CT imaging but statistically likely benign. No suspicious renal
lesion, urolithiasis or hydronephrosis. Urinary bladder is largely
decompressed at the time of exam and therefore poorly evaluated by
CT imaging. No gross bladder abnormality accounting for
underdistention.

Stomach/Bowel: Distal esophagus, stomach and duodenal sweep are
unremarkable. No small bowel wall thickening or dilatation. No
evidence of obstruction. A normal appendix is visualized. No colonic
dilatation or wall thickening.

Vascular/Lymphatic: Atherosclerotic calcifications within the
abdominal aorta and branch vessels. No aneurysm or ectasia. No
enlarged abdominopelvic lymph nodes.

Reproductive: Anteverted uterus. No concerning uterine mass or
adnexal lesions.

Other: No abdominopelvic free fluid or free gas. No bowel containing
hernias.

Musculoskeletal: No acute osseous abnormality or suspicious osseous
lesion. Transitional lumbosacral vertebrae noted with partial fusion
to the left sacral ala.
IMPRESSION: Redemonstration of the large cystic lesion occupying much of the
right lobe liver measuring up to 21 cm in size, not significantly
changed from comparison prior. There is in intermediate attenuation
to this lesion albeit with gradient density which could suggest some
layering hemorrhagic or proteinaceous debris. More focal
hypoattenuating rounded foci could reflect some loculated components
corresponding to more T2 hyperintense foci on MR imaging. No
significant surrounding inflammation or thick-walled rim enhancement
to suggest developing abscess nor evidence to suggest active rupture
at this time.

## 2022-10-21 ENCOUNTER — Other Ambulatory Visit: Payer: Self-pay | Admitting: Internal Medicine

## 2022-10-22 MED ORDER — LISINOPRIL 20 MG PO TABS: 20.0000 mg | ORAL_TABLET | Freq: Every morning | ORAL | 1 refills | Status: AC

## 2023-09-21 ENCOUNTER — Emergency Department (HOSPITAL_BASED_OUTPATIENT_CLINIC_OR_DEPARTMENT_OTHER)
Admission: EM | Admit: 2023-09-21 | Discharge: 2023-09-21 | Disposition: A | Discharge status: Home / Self Care | Payer: 59 | Attending: Emergency Medicine | Admitting: Emergency Medicine

## 2023-09-21 ENCOUNTER — Encounter (HOSPITAL_BASED_OUTPATIENT_CLINIC_OR_DEPARTMENT_OTHER): Payer: Self-pay | Admitting: Emergency Medicine

## 2023-09-21 ENCOUNTER — Emergency Department (HOSPITAL_BASED_OUTPATIENT_CLINIC_OR_DEPARTMENT_OTHER): Payer: 59 | Admitting: Radiology

## 2023-09-21 DIAGNOSIS — R051 Acute cough: Secondary | ICD-10-CM

## 2023-09-21 DIAGNOSIS — I10 Essential (primary) hypertension: Secondary | ICD-10-CM | POA: Insufficient documentation

## 2023-09-21 DIAGNOSIS — R059 Cough, unspecified: Secondary | ICD-10-CM | POA: Diagnosis present

## 2023-09-21 DIAGNOSIS — Z79899 Other long term (current) drug therapy: Secondary | ICD-10-CM | POA: Insufficient documentation

## 2023-09-21 DIAGNOSIS — Z1152 Encounter for screening for COVID-19: Secondary | ICD-10-CM | POA: Diagnosis not present

## 2023-09-21 DIAGNOSIS — J189 Pneumonia, unspecified organism: Secondary | ICD-10-CM | POA: Diagnosis not present

## 2023-09-21 LAB — RESP PANEL BY RT-PCR (RSV, FLU A&B, COVID)  RVPGX2
Influenza A by PCR: NEGATIVE
Influenza B by PCR: NEGATIVE
Resp Syncytial Virus by PCR: NEGATIVE
SARS Coronavirus 2 by RT PCR: NEGATIVE

## 2023-09-21 MED ORDER — AEROCHAMBER PLUS FLO-VU MISC
1.0000 | Freq: Once | Status: AC
  Administered 2023-09-21: 1
  Filled 2023-09-21: qty 1

## 2023-09-21 MED ORDER — AMOXICILLIN 500 MG PO CAPS: 1000.0000 mg | ORAL_CAPSULE | Freq: Three times a day (TID) | ORAL | 0 refills | Status: AC

## 2023-09-21 MED ORDER — PROMETHAZINE-DM 6.25-15 MG/5ML PO SYRP: 5.0000 mL | ORAL_SOLUTION | Freq: Four times a day (QID) | ORAL | 0 refills | Status: AC | PRN

## 2023-09-21 MED ORDER — ALBUTEROL SULFATE HFA 108 (90 BASE) MCG/ACT IN AERS
2.0000 | INHALATION_SPRAY | RESPIRATORY_TRACT | Status: DC | PRN
  Administered 2023-09-21: 2 via RESPIRATORY_TRACT
  Filled 2023-09-21: qty 6.7

## 2023-09-21 MED ORDER — FLUCONAZOLE 150 MG PO TABS: 150.0000 mg | ORAL_TABLET | Freq: Once | ORAL | 0 refills | Status: AC | PRN

## 2023-09-21 MED ORDER — AZITHROMYCIN 250 MG PO TABS: 250.0000 mg | ORAL_TABLET | Freq: Every day | ORAL | 0 refills | Status: AC

## 2023-09-21 NOTE — ED Provider Notes (Signed)
[place] EMERGENCY DEPARTMENT AT [facility] Provider Note   CSN: [id number] Arrival date & time: 09/21/23  1011     History  Chief Complaint  Patient presents with   Shortness of Breath    Madison Murillo is a 47 y.o. female.  Patient with hypertension, migraine, history of liver surgery and right lower lung pneumonia --presents to the emergency department today for evaluation of cough and shortness of breath.  Patient started with upper respiratory symptoms about 5 days ago which she describes as a "head cold".  She reports low-grade fever to around 100 F.  She mainly had runny nose, congestion and cough.  2 days ago she became more fatigued and cough became more productive.  She has felt worse over the past 2 days with right lower side pain as well.  She feels short of breath like she cannot get enough air.  No nausea, vomiting, diarrhea.  No lower extremity swelling that is out of the ordinary.       Home Medications Prior to Admission medications   Medication Sig Start Date End Date Taking? Authorizing Provider  amLODipine (NORVASC) 5 MG tablet Take 1 tablet (5 mg total) by mouth daily. 12/18/20 04/05/24  Glenford Bayley, MD  ferrous sulfate 325 (65 FE) MG tablet Take 1 tablet (325 mg total) by mouth in the morning, at noon, and at bedtime. 07/24/22 08/23/22  Adela Glimpse, NP  gabapentin (NEURONTIN) 100 MG capsule Take 100 mg by mouth 3 (three) times daily as needed (neuropathy). 06/22/21   [provider]  hydrochlorothiazide (MICROZIDE) 12.5 MG capsule Take 12.5 mg by mouth in the morning. 03/09/21   [provider]  ibuprofen (ADVIL) 200 MG tablet Take 600-800 mg by mouth every 6 (six) hours as needed for fever, headache or mild pain.    [provider]  lisinopril (ZESTRIL) 20 MG tablet Take 1 tablet (20 mg total) by mouth in the morning. 10/22/22   Adela Glimpse, NP  meclizine (ANTIVERT) 25 MG tablet Take 1 tablet (25 mg total) by mouth 3  (three) times daily as needed for dizziness. 07/19/21   Haskel Schroeder, PA-C  oxyCODONE-acetaminophen (PERCOCET/ROXICET) 5-325 MG tablet Take 1-2 tablets by mouth every 6 (six) hours as needed for severe pain. 05/03/21   Petrucelli, Samantha R, PA-C  Rimegepant Sulfate (NURTEC) 75 MG TBDP Take 75 mg by mouth as needed (Take 1 tab as needed with onset of migraine). 06/12/22   Raynelle Dick, NP  traMADol (ULTRAM) 50 MG tablet Take 50 mg by mouth every 6 (six) hours as needed for moderate pain. 06/22/21   [provider]      Allergies    Food, Codeine, and Gluten meal    Review of Systems   Review of Systems  Physical Exam Updated Vital Signs BP (!) 198/101 (BP Location: Right Arm)   Pulse 84   Temp 98.2 F (36.8 C) (Oral)   Resp (!) 22   LMP 03/04/2023 (Approximate)   SpO2 96%   Physical Exam Vitals and nursing note reviewed.  Constitutional:      General: She is not in acute distress.    Appearance: She is well-developed.  HENT:     Head: Normocephalic and atraumatic.     Right Ear: External ear normal.     Left Ear: External ear normal.     Nose: Congestion present.     Mouth/Throat:     Mouth: Mucous membranes are moist.  Eyes:  Conjunctiva/sclera: Conjunctivae normal.  Cardiovascular:     Rate and Rhythm: Normal rate and regular rhythm.     Heart sounds: No murmur heard. Pulmonary:     Effort: No respiratory distress.     Breath sounds: No decreased breath sounds, wheezing, rhonchi or rales.  Abdominal:     Palpations: Abdomen is soft.     Tenderness: There is no abdominal tenderness. There is no guarding or rebound.  Musculoskeletal:     Cervical back: Normal range of motion and neck supple.     Right lower leg: No edema.     Left lower leg: No edema.  Skin:    General: Skin is warm and dry.     Findings: No rash.  Neurological:     General: No focal deficit present.     Mental Status: She is alert. Mental status is at baseline.     Motor:  No weakness.  Psychiatric:        Mood and Affect: Mood normal.     ED Results / Procedures / Treatments   Labs (all labs ordered are listed, but only abnormal results are displayed) Labs Reviewed  RESP PANEL BY RT-PCR (RSV, FLU A&B, COVID)  [id number]    EKG EKG Interpretation Date/Time:  Saturday September 21 2023 11:06:48 EST Ventricular Rate:  82 PR Interval:  123 QRS Duration:  92 QT Interval:  365 QTC Calculation: 427 R Axis:   27  Text Interpretation: Sinus rhythm Low voltage, precordial leads Borderline T abnormalities, inferior leads Confirmed by Vonita Moss [id number]) on 09/21/2023 11:57:28 AM  Radiology DG Chest 2 View  Result Date: 09/21/2023 CLINICAL DATA:  Shortness of breath and cough EXAM: CHEST - 2 VIEW COMPARISON:  Chest radiograph dated 03/26/2018 FINDINGS: Normal lung volumes. Bilateral lower lung patchy opacities, right-greater-than-left. No pleural effusion or pneumothorax. The heart size and mediastinal contours are within normal limits. No acute osseous abnormality. IMPRESSION: Bilateral lower lung patchy opacities, right-greater-than-left, which may represent atelectasis or infection. Electronically Signed   By: Agustin Cree M.D.   On: 09/21/2023 11:38    Procedures Procedures    Medications Ordered in ED Medications  albuterol (VENTOLIN HFA) 108 (90 Base) MCG/ACT inhaler 2 puff (2 puffs Inhalation Given 09/21/23 1054)  Aerochamber Plus device 1 each (1 each Other Given 09/21/23 1054)    ED Course/ Medical Decision Making/ A&P    Patient seen and examined. History obtained directly from patient.   Labs/EKG: Flu, COVID, RSV  Imaging: Chest x-ray ordered  Medications/Fluids: None ordered  Most recent vital signs reviewed and are as follows: BP (!) 198/101 (BP Location: Right Arm)   Pulse 84   Temp 98.2 F (36.8 C) (Oral)   Resp (!) 22   LMP 03/04/2023 (Approximate)   SpO2 96%   Initial impression: Patient does not appear to be distressed,  no hypoxia, no tachypnea, no increased work of breathing.  Will evaluate for pneumonia with chest x-ray.  Will send viral panel.  12:06 PM Reassessment performed. Patient appears stable.  No respiratory distress.  Labs personally reviewed and interpreted including: Flu, COVID, RSV was negative.  Imaging personally visualized and interpreted including: Chest x-ray agree basilar opacities, question infiltrate versus atelectasis.  Reviewed pertinent lab work and imaging with patient at bedside. Questions answered.   Most current vital signs reviewed and are as follows: BP (!) 182/91   Pulse 91   Temp 98.2 F (36.8 C) (Oral)   Resp 20   LMP 03/04/2023 (Approximate)  SpO2 99%   Plan: Discharge to home.   Prescriptions written for: Amoxicillin, azithromycin, Diflucan for yeast infection related to antibiotics, promethazine cough syrup  Other home care instructions discussed: Albuterol 2 puffs every 4 hours as needed, rest, hydration  ED return instructions discussed: New or worsening symptoms, increased shortness of breath or trouble breathing, persistent vomiting  Follow-up instructions discussed: Patient encouraged to follow-up with their PCP in 5 days if not improving.                                Medical Decision Making Amount and/or Complexity of Data Reviewed Radiology: ordered.  Risk Prescription drug management.   Patient with URI symptoms now moving more into her chest.  She has had significant productive cough.  X-ray cannot rule out pneumonia.  Patient has had fevers at home, afebrile here.  No hypoxia.  Given clinical picture and x-ray findings, will cover for pneumonia.  Will also provide medication for cough suppression.  No concern for ACS, PE.  Blood pressures have been elevated here.  Patient encouraged to continue home medications and monitor blood pressures.  If these are still persistently elevated after her illness resolves, she will need to follow-up with  her primary care doctor to consider medication adjustments.  The patient's vital signs, pertinent lab work and imaging were reviewed and interpreted as discussed in the ED course. Hospitalization was considered for further testing, treatments, or serial exams/observation. However as patient is well-appearing, has a stable exam, and reassuring studies today, I do not feel that they warrant admission at this time. This plan was discussed with the patient who verbalizes agreement and comfort with this plan and seems reliable and able to return to the Emergency Department with worsening or changing symptoms.          Final Clinical Impression(s) / ED Diagnoses Final diagnoses:  Community acquired pneumonia, unspecified laterality  Acute cough    Rx / DC Orders ED Discharge Orders          Ordered    amoxicillin (AMOXIL) 500 MG capsule  3 times daily        09/21/23 1203    azithromycin (ZITHROMAX) 250 MG tablet  Daily        09/21/23 1203    fluconazole (DIFLUCAN) 150 MG tablet  Once PRN        09/21/23 1203    promethazine-dextromethorphan (PROMETHAZINE-DM) 6.25-15 MG/5ML syrup  4 times daily PRN        09/21/23 1203              Renne Crigler, PA-C 09/21/23 1209    Rondel Baton, MD 09/25/23 1239

## 2023-09-21 NOTE — Discharge Instructions (Signed)
Please read and follow all provided instructions.  Your diagnoses today include:  1. Community acquired pneumonia, unspecified laterality   2. Acute cough     Tests performed today include: Chest x-ray --haziness noted in the lower lung bases, question of pneumonia Flu, COVID, RSV were negative Vital signs. See below for your results today.   Medications prescribed:  Amoxicillin - antibiotic  You have been prescribed an antibiotic medicine: take the entire course of medicine even if you are feeling better. Stopping early can cause the antibiotic not to work.  Azithromycin - antibiotic for respiratory infection  You have been prescribed an antibiotic medicine: take the entire course of medicine even if you are feeling better. Stopping early can cause the antibiotic not to work.  Promethazine cough syrup for suppression  Take any prescribed medications only as directed.  Home care instructions:  Follow any educational materials contained in this packet.  Take the complete course of antibiotics that you were prescribed.   BE VERY CAREFUL not to take multiple medicines containing Tylenol (also called acetaminophen). Doing so can lead to an overdose which can damage your liver and cause liver failure and possibly death.   Follow-up instructions: Please follow-up with your primary care provider in the next 5 days for further evaluation of your symptoms and to ensure resolution of your infection.   Return instructions:  Please return to the Emergency Department if you experience worsening symptoms.  Return immediately with worsening breathing, worsening shortness of breath, or if you feel it is taking you more effort to breathe.  Please return if you have any other emergent concerns.  Additional Information:  Your vital signs today were: BP (!) 182/91   Pulse 91   Temp 98.2 F (36.8 C) (Oral)   Resp 20   LMP 03/04/2023 (Approximate)   SpO2 99%  If your blood pressure (BP)  was elevated above 135/85 this visit, please have this repeated by your doctor within one month. --------------

## 2023-09-21 NOTE — ED Triage Notes (Signed)
Pt c/o SOB that began with a productive cough and cold symptoms on Monday, worse with exertion since yesterday and that she feels she "can't catch her breath" today..  Reports h/o right lower lung injury and states right lower lobe pain with coughing that began last night.

## 2023-09-23 ENCOUNTER — Encounter: Payer: Self-pay | Admitting: Nurse Practitioner
